# Patient Record
Sex: Male | Born: 1990 | Race: Black or African American | Hispanic: No | Marital: Single | Smoking: Never smoker
Health system: Southern US, Community
[De-identification: ages and names within clinical notes are randomized; demographics above are authoritative.]

## PROBLEM LIST (undated history)

## (undated) DIAGNOSIS — K219 Gastro-esophageal reflux disease without esophagitis: Secondary | ICD-10-CM

## (undated) DIAGNOSIS — R7303 Prediabetes: Secondary | ICD-10-CM

---
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
## 2012-02-14 HISTORY — PX: KNEE ARTHROSCOPY: SHX127

## 2012-07-01 ENCOUNTER — Encounter (HOSPITAL_COMMUNITY): Payer: Self-pay | Admitting: *Deleted

## 2012-07-01 ENCOUNTER — Emergency Department (HOSPITAL_COMMUNITY)
Admission: EM | Admit: 2012-07-01 | Discharge: 2012-07-01 | Disposition: A | Discharge status: Home / Self Care | Payer: BC Managed Care – PPO | Attending: Emergency Medicine | Admitting: Emergency Medicine

## 2012-07-01 DIAGNOSIS — T819XXA Unspecified complication of procedure, initial encounter: Secondary | ICD-10-CM

## 2012-07-01 DIAGNOSIS — IMO0002 Reserved for concepts with insufficient information to code with codable children: Secondary | ICD-10-CM | POA: Insufficient documentation

## 2012-07-01 DIAGNOSIS — K219 Gastro-esophageal reflux disease without esophagitis: Secondary | ICD-10-CM | POA: Insufficient documentation

## 2012-07-01 DIAGNOSIS — M7989 Other specified soft tissue disorders: Secondary | ICD-10-CM | POA: Insufficient documentation

## 2012-07-01 DIAGNOSIS — Y838 Other surgical procedures as the cause of abnormal reaction of the patient, or of later complication, without mention of misadventure at the time of the procedure: Secondary | ICD-10-CM | POA: Insufficient documentation

## 2012-07-01 DIAGNOSIS — Z8679 Personal history of other diseases of the circulatory system: Secondary | ICD-10-CM | POA: Insufficient documentation

## 2012-07-01 DIAGNOSIS — Z79899 Other long term (current) drug therapy: Secondary | ICD-10-CM | POA: Insufficient documentation

## 2012-07-01 NOTE — ED Provider Notes (Signed)
History/physical exam/procedure(s) were performed by non-physician practitioner and as supervising physician I was immediately available for consultation/collaboration. I have reviewed all notes and am in agreement with care and plan.   Hilario Quarry, MD 07/01/12 [phone]

## 2012-07-01 NOTE — ED Provider Notes (Signed)
History     CSN: [id number]  Arrival date & time 07/01/12  0124   First MD Initiated Contact with Patient 07/01/12 0144      Chief Complaint  Patient presents with  . Leg Pain  . Leg Swelling    (Consider location/radiation/quality/duration/timing/severity/associated sxs/prior treatment) HPI History provided by pt.   Pt had an arthroscopic knee procedure 7 days ago.  3 days later he developed edema and pain in right calf.  Had a venous doppler performed in ED in [place] the following day and it was negative for DVT.  Pain is minimal and edema stable, but he noticed ecchymosis of calf today, which concerned him.  Denies trauma but has been wearing an ace bandage very tightly.  No associated fever, CP/SOB, extremity paresthesias or ecchymosis anywhere else.    Past Medical History  Diagnosis Date  . Acid reflux   . Chest pain     Past Surgical History  Procedure Laterality Date  . Knee arthroscopy      No family history on file.  History  Substance Use Topics  . Smoking status: Never Smoker   . Smokeless tobacco: Not on file  . Alcohol Use: No      Review of Systems  All other systems reviewed and are negative.    Allergies  Review of patient's allergies indicates no known allergies.  Home Medications   Current Outpatient Rx  Name  Route  Sig  Dispense  Refill  . omeprazole (PRILOSEC) 40 MG capsule   Oral   Take 40 mg by mouth daily.           BP 152/81  Temp(Src) 97 F (36.1 C) (Oral)  Resp 18  SpO2 100%  Physical Exam  Nursing note and vitals reviewed. Constitutional: He is oriented to person, place, and time. He appears well-developed and well-nourished. No distress.  Morbidly obese  HENT:  Head: Normocephalic and atraumatic.  Eyes:  Normal appearance  Neck: Normal range of motion.  Cardiovascular: Normal rate and regular rhythm.   Pulmonary/Chest: Effort normal and breath sounds normal. No respiratory distress.  Musculoskeletal: Normal  range of motion.  Right calf mildly edematous compared to L and there is dark purple ecchymosis.  Minimally tender.  No induration. R knee w/out edema, erythema, warmth or tenderness and nml active ROM.  Neurological: He is alert and oriented to person, place, and time.  Skin: Skin is warm and dry. No rash noted.  Psychiatric: He has a normal mood and affect. His behavior is normal.    ED Course  Procedures (including critical care time)  Labs Reviewed - No data to display No results found.   1. Post-operative complication, initial encounter       MDM  22yo M had R arthroscopic knee surgery one week ago, presented to ED w/ RLE edema/pain 4 days later and had negative DVT study, and presents today w/ non-traumatic ecchymosis of calf.  Edema and pain stable and no CP/SOB.  Discussed w/ Dr. Rosalia Hammers and she believes that the ecchymosis is related to procedure and that DVT unlikely based on recent nml venous doppler.  Pt reassured.  Recommended ice/elevation.  Strict return precautions discussed.         Otilio Miu, PA-C 07/01/12 [phone]

## 2012-07-01 NOTE — ED Notes (Signed)
Pt discharged.Vital signs stable and GCS 15 

## 2012-07-01 NOTE — ED Notes (Addendum)
Recent R knee arthroscopic knee surgery (done in [place]). RLE swelling & pain onset Thursday. Doppler studies done Friday ("negative for DVT"), has had wrapped with ACE bandage. Drove to [place] today. Concerned about increasing bruising/ discoloration in R calf. Mentions upcoming trip. CMS intact, leg and foot warm equal to left. Pedal pulses palpable. Scant pitting edema +1.

## 2014-02-13 HISTORY — PX: UPPER GI ENDOSCOPY: SHX6162

## 2015-03-14 ENCOUNTER — Emergency Department (HOSPITAL_COMMUNITY)
Admission: EM | Admit: 2015-03-14 | Discharge: 2015-03-14 | Disposition: A | Discharge status: Home / Self Care | Payer: BLUE CROSS/BLUE SHIELD | Source: Home / Self Care | Attending: Emergency Medicine | Admitting: Emergency Medicine

## 2015-03-14 ENCOUNTER — Encounter (HOSPITAL_COMMUNITY): Payer: Self-pay | Admitting: Emergency Medicine

## 2015-03-14 DIAGNOSIS — L509 Urticaria, unspecified: Secondary | ICD-10-CM

## 2015-03-14 MED ORDER — DIPHENHYDRAMINE HCL 25 MG PO CAPS: ORAL_CAPSULE | ORAL | Status: DC

## 2015-03-14 MED ORDER — FAMOTIDINE 20 MG PO TABS: 20.0000 mg | ORAL_TABLET | Freq: Two times a day (BID) | ORAL | Status: DC

## 2015-03-14 MED ORDER — PREDNISONE 10 MG PO TABS: ORAL_TABLET | ORAL | Status: DC

## 2015-03-14 NOTE — ED Notes (Signed)
The patient presented to the [facility] with a complaint of hives that have been there for 3 days.

## 2015-03-14 NOTE — ED Provider Notes (Signed)
CSN: [id number]     Arrival date & time 03/14/15  1818 History   First MD Initiated Contact with Patient 03/14/15 1844     Chief Complaint  Patient presents with  . Urticaria   (Consider location/radiation/quality/duration/timing/severity/associated sxs/prior Treatment) Patient is a 25 y.o. male presenting with urticaria. The history is provided by the patient. No language interpreter was used.  Urticaria This is a new problem. The problem occurs constantly. The problem has been gradually worsening. Pertinent negatives include no chest pain and no abdominal pain. Nothing aggravates the symptoms. Nothing relieves the symptoms. He has tried nothing for the symptoms. The treatment provided no relief.   Pt complains of a full body rash.   Pt complains of itching.  Pt recently changed soap and washing powders. Past Medical History  Diagnosis Date  . Acid reflux   . Chest pain    Past Surgical History  Procedure Laterality Date  . Knee arthroscopy     History reviewed. No pertinent family history. Social History  Substance Use Topics  . Smoking status: Never Smoker   . Smokeless tobacco: None  . Alcohol Use: No    Review of Systems  Cardiovascular: Negative for chest pain.  Gastrointestinal: Negative for abdominal pain.  All other systems reviewed and are negative.   Allergies  Review of patient's allergies indicates no known allergies.  Home Medications   Prior to Admission medications   Medication Sig Start Date End Date Taking? Authorizing Provider  ferrous sulfate 325 (65 FE) MG tablet Take 325 mg by mouth daily with breakfast.   Yes Historical Provider, MD  omeprazole (PRILOSEC) 40 MG capsule Take 40 mg by mouth daily.   Yes Historical Provider, MD  Vitamin D, Ergocalciferol, (DRISDOL) 50000 units CAPS capsule Take 50,000 Units by mouth every 7 (seven) days.   Yes Historical Provider, MD  diphenhydrAMINE (BENADRYL) 25 mg capsule 2 tablets every 4 hours for 2 days 03/14/15    Elson Areas, PA-C  famotidine (PEPCID) 20 MG tablet Take 1 tablet (20 mg total) by mouth 2 (two) times daily. 03/14/15   Elson Areas, PA-C  predniSONE (DELTASONE) 10 MG tablet 4,0,9,8,1,1 03/14/15   Elson Areas, PA-C   Meds Ordered and Administered this Visit  Medications - No data to display  BP 142/102 mmHg  Pulse 106  Temp(Src) 97 F (36.1 C) (Oral)  SpO2 95% No data found.   Physical Exam  Constitutional: He appears well-developed and well-nourished.  HENT:  Head: Normocephalic.  Mouth/Throat: Oropharynx is clear and moist.  Eyes: Conjunctivae are normal. Pupils are equal, round, and reactive to light.  Neck: Normal range of motion. Neck supple.  Cardiovascular: Normal rate.   Pulmonary/Chest: Effort normal.  Abdominal: Soft.  Musculoskeletal: Normal range of motion.  Neurological: He is alert.  Skin: Rash noted. There is erythema.  Psychiatric: He has a normal mood and affect.  Nursing note and vitals reviewed.   ED Course  Procedures (including critical care time)  Labs Review Labs Reviewed - No data to display  Imaging Review No results found.   Visual Acuity Review  Right Eye Distance:   Left Eye Distance:   Bilateral Distance:    Right Eye Near:   Left Eye Near:    Bilateral Near:         MDM  Pt advised hypoallergenic products.     1. Hives    Meds ordered this encounter  Medications  . ferrous sulfate 325 (65 FE) MG  tablet    Sig: Take 325 mg by mouth daily with breakfast.  . Vitamin D, Ergocalciferol, (DRISDOL) 50000 units CAPS capsule    Sig: Take 50,000 Units by mouth every 7 (seven) days.  . famotidine (PEPCID) 20 MG tablet    Sig: Take 1 tablet (20 mg total) by mouth 2 (two) times daily.    Dispense:  20 tablet    Refill:  0    Order Specific Question:  Supervising Provider    Answer:  Charm Rings [id number]  . predniSONE (DELTASONE) 10 MG tablet    Sig: 6,5,4,3,2,1    Dispense:  21 tablet    Refill:  0    Order  Specific Question:  Supervising Provider    Answer:  Charm Rings [id number]  . diphenhydrAMINE (BENADRYL) 25 mg capsule    Sig: 2 tablets every 4 hours for 2 days    Dispense:  30 capsule    Refill:  0    Order Specific Question:  Supervising Provider    Answer:  Charm Rings [phone]  An After Visit Summary was printed and given to the patient.    Elson Areas, PA-C 03/14/15 1957  Elson Areas, PA-C 03/14/15 2003

## 2015-03-14 NOTE — Discharge Instructions (Signed)
Hives Hives are itchy, red, swollen areas of the skin. They can vary in size and location on your body. Hives can come and go for hours or several days (acute hives) or for several weeks (chronic hives). Hives do not spread from person to person (noncontagious). They may get worse with scratching, exercise, and emotional stress. CAUSES   Allergic reaction to food, additives, or drugs.  Infections, including the common cold.  Illness, such as vasculitis, lupus, or thyroid disease.  Exposure to sunlight, heat, or cold.  Exercise.  Stress.  Contact with chemicals. SYMPTOMS   Red or white swollen patches on the skin. The patches may change size, shape, and location quickly and repeatedly.  Itching.  Swelling of the hands, feet, and face. This may occur if hives develop deeper in the skin. DIAGNOSIS  Your caregiver can usually tell what is wrong by performing a physical exam. Skin or blood tests may also be done to determine the cause of your hives. In some cases, the cause cannot be determined. TREATMENT  Mild cases usually get better with medicines such as antihistamines. Severe cases may require an emergency epinephrine injection. If the cause of your hives is known, treatment includes avoiding that trigger.  HOME CARE INSTRUCTIONS   Avoid causes that trigger your hives.  Take antihistamines as directed by your caregiver to reduce the severity of your hives. Non-sedating or low-sedating antihistamines are usually recommended. Do not drive while taking an antihistamine.  Take any other medicines prescribed for itching as directed by your caregiver.  Wear loose-fitting clothing.  Keep all follow-up appointments as directed by your caregiver. SEEK MEDICAL CARE IF:   You have persistent or severe itching that is not relieved with medicine.  You have painful or swollen joints. SEEK IMMEDIATE MEDICAL CARE IF:   You have a fever.  Your tongue or lips are swollen.  You have  trouble breathing or swallowing.  You feel tightness in the throat or chest.  You have abdominal pain. These problems may be the first sign of a life-threatening allergic reaction. Call your local emergency services (911 in [place] MAKE SURE YOU:   Understand these instructions.  Will watch your condition.  Will get help right away if you are not doing well or get worse.   This information is not intended to replace advice given to you by your health care provider. Make sure you discuss any questions you have with your health care provider.   Document Released: 01/30/2005 Document Revised: 02/04/2013 Document Reviewed: 04/25/2011 Elsevier Interactive Patient Education 2016 [employer]  

## 2016-03-30 DIAGNOSIS — H5203 Hypermetropia, bilateral: Secondary | ICD-10-CM | POA: Diagnosis not present

## 2016-07-05 ENCOUNTER — Ambulatory Visit: Payer: BLUE CROSS/BLUE SHIELD | Admitting: Registered"

## 2016-07-06 ENCOUNTER — Ambulatory Visit: Payer: BLUE CROSS/BLUE SHIELD | Admitting: Dietician

## 2016-07-25 ENCOUNTER — Ambulatory Visit: Payer: BLUE CROSS/BLUE SHIELD | Admitting: Registered"

## 2016-10-17 DIAGNOSIS — Z23 Encounter for immunization: Secondary | ICD-10-CM | POA: Diagnosis not present

## 2016-10-17 DIAGNOSIS — D509 Iron deficiency anemia, unspecified: Secondary | ICD-10-CM | POA: Diagnosis not present

## 2016-10-17 DIAGNOSIS — E559 Vitamin D deficiency, unspecified: Secondary | ICD-10-CM | POA: Diagnosis not present

## 2016-10-17 DIAGNOSIS — K219 Gastro-esophageal reflux disease without esophagitis: Secondary | ICD-10-CM | POA: Diagnosis not present

## 2016-10-17 DIAGNOSIS — R030 Elevated blood-pressure reading, without diagnosis of hypertension: Secondary | ICD-10-CM | POA: Diagnosis not present

## 2016-10-17 DIAGNOSIS — Z6841 Body Mass Index (BMI) 40.0 and over, adult: Secondary | ICD-10-CM | POA: Diagnosis not present

## 2016-10-17 DIAGNOSIS — R7303 Prediabetes: Secondary | ICD-10-CM | POA: Diagnosis not present

## 2016-10-17 DIAGNOSIS — Z020 Encounter for examination for admission to educational institution: Secondary | ICD-10-CM | POA: Diagnosis not present

## 2016-11-15 ENCOUNTER — Other Ambulatory Visit (HOSPITAL_COMMUNITY): Payer: Self-pay | Admitting: General Surgery

## 2016-11-15 DIAGNOSIS — M25561 Pain in right knee: Secondary | ICD-10-CM | POA: Diagnosis not present

## 2016-11-15 DIAGNOSIS — K219 Gastro-esophageal reflux disease without esophagitis: Secondary | ICD-10-CM | POA: Diagnosis not present

## 2016-11-15 DIAGNOSIS — M25562 Pain in left knee: Secondary | ICD-10-CM | POA: Diagnosis not present

## 2016-11-15 DIAGNOSIS — G8929 Other chronic pain: Secondary | ICD-10-CM | POA: Diagnosis not present

## 2016-11-15 DIAGNOSIS — K449 Diaphragmatic hernia without obstruction or gangrene: Secondary | ICD-10-CM | POA: Diagnosis not present

## 2016-11-15 DIAGNOSIS — Z6841 Body Mass Index (BMI) 40.0 and over, adult: Secondary | ICD-10-CM | POA: Diagnosis not present

## 2016-11-24 ENCOUNTER — Other Ambulatory Visit: Payer: Self-pay

## 2016-11-24 ENCOUNTER — Ambulatory Visit (HOSPITAL_COMMUNITY)
Admission: RE | Admit: 2016-11-24 | Discharge: 2016-11-24 | Disposition: A | Discharge status: Home / Self Care | Payer: 59 | Source: Ambulatory Visit | Attending: General Surgery | Admitting: General Surgery

## 2016-11-24 DIAGNOSIS — K449 Diaphragmatic hernia without obstruction or gangrene: Secondary | ICD-10-CM | POA: Diagnosis not present

## 2016-11-24 DIAGNOSIS — K219 Gastro-esophageal reflux disease without esophagitis: Secondary | ICD-10-CM | POA: Insufficient documentation

## 2016-11-24 DIAGNOSIS — Z9884 Bariatric surgery status: Secondary | ICD-10-CM | POA: Diagnosis not present

## 2016-11-27 ENCOUNTER — Encounter: Payer: 59 | Attending: General Surgery | Admitting: Registered"

## 2016-11-27 ENCOUNTER — Encounter: Payer: Self-pay | Admitting: Registered"

## 2016-11-27 DIAGNOSIS — E669 Obesity, unspecified: Secondary | ICD-10-CM

## 2016-11-27 DIAGNOSIS — Z713 Dietary counseling and surveillance: Secondary | ICD-10-CM | POA: Diagnosis not present

## 2016-11-27 NOTE — Progress Notes (Signed)
Pre-Op Assessment Visit:  Pre-Operative Sleeve Gastrectomy Surgery  Medical Nutrition Therapy:  Appt start time: 11:15  End time:  12:10  Patient was seen on 11/27/2016 for Pre-Operative Nutrition Assessment. Assessment and letter of approval faxed to [facility] Bariatric Surgery Program coordinator on 11/27/2016.   Pt expectation of surgery: prevent future health risks, be healthier for family  Pt expectation of Dietitian: needs an alternative to ginger ale, support  Start weight at [facility]: 385.8 BMI: 56.16   Pt states he does not eat pork or fried foods often. Pt states his dad had triple bypass surgery recently which was a reality check for him to improve his health.   Per insurance, pt needs 0 SWL visits prior to surgery.    24 hr Dietary Recall: First Meal: sometimes skips; Restaurant-sausage, grits, bacon, eggs, 2 slices of toast Snack: none Second Meal: sometimes skips; [employer]-grilled chicken salad Snack: sometimes nabs Third Meal: Sushi or grilled salmon, spinach Snack: none Beverages: water, ginger ale  Encouraged to engage in 75 minutes of moderate physical activity including cardiovascular and weight baring weekly  Handouts given during visit include:  . Pre-Op Goals . Bariatric Surgery Protein Shakes . Vitamin and Mineral Recommendations  During the appointment today the following Pre-Op Goals were reviewed with the patient: . Maintain or lose weight as instructed by your surgeon . Make healthy food choices . Begin to limit portion sizes . Limited concentrated sugars and fried foods . Keep fat/sugar in the single digits per serving on          food labels . Practice CHEWING your food  (aim for 30 chews per bite or until applesauce consistency) . Practice not drinking 15 minutes before, during, and 30 minutes after each meal/snack . Avoid all carbonated beverages  . Avoid/limit caffeinated beverages  . Avoid all sugar-sweetened  beverages . Consume 3 meals per day; eat every 3-5 hours . Make a list of non-food related activities . Aim for 64-100 ounces of FLUID daily  . Aim for at least 60-80 grams of PROTEIN daily . Look for a liquid protein source that contain ?15 g protein and ?5 g carbohydrate  (ex: shakes, drinks, shots) . Physical activity is an important part of a healthy lifestyle so keep it moving!  Follow diet recommendations listed below Energy and Macronutrient Recommendations: Calories: 2000 Carbohydrate: 225 Protein: 150 Fat: 56  Demonstrated degree of understanding via:  Teach Back   Teaching Method Utilized:  Visual Auditory Hands on  Barriers to learning/adherence to lifestyle change: none  Patient to call the [facility] Education Services to enroll in Pre-Op and Post-Op Nutrition Education when surgery date is scheduled.

## 2016-12-28 ENCOUNTER — Ambulatory Visit: Payer: 59 | Admitting: Psychology

## 2016-12-28 DIAGNOSIS — F509 Eating disorder, unspecified: Secondary | ICD-10-CM | POA: Diagnosis not present

## 2017-01-01 ENCOUNTER — Encounter: Payer: Self-pay | Admitting: Skilled Nursing Facility1

## 2017-01-01 ENCOUNTER — Encounter: Payer: 59 | Attending: General Surgery | Admitting: Skilled Nursing Facility1

## 2017-01-01 DIAGNOSIS — E669 Obesity, unspecified: Secondary | ICD-10-CM

## 2017-01-01 DIAGNOSIS — Z713 Dietary counseling and surveillance: Secondary | ICD-10-CM | POA: Insufficient documentation

## 2017-01-01 NOTE — Progress Notes (Signed)
Pre-Operative Nutrition Class:  Appt start time: 9628   End time:  1830.  Patient was seen on 01/01/2017 for Pre-Operative Bariatric Surgery Education [facility].   Surgery date:  Surgery type: sleeve Start weight at [facility]: 395.8 Weight today: pt arrived too late  Samples given per MNT protocol. Patient educated on appropriate usage: Bariatric Advantage Multivitamin Lot # [id number] Exp: 7/19  Bariatric Advantage Calcium  Lot # [id number] Exp: sep-09-2017  Unjury Protein Shake Lot # [id number]  The following the learning objectives were met by the patient during this course:  Identify Pre-Op Dietary Goals and will begin 2 weeks pre-operatively  Identify appropriate sources of fluids and proteins   State protein recommendations and appropriate sources pre and post-operatively  Identify Post-Operative Dietary Goals and will follow for 2 weeks post-operatively  Identify appropriate multivitamin and calcium sources  Describe the need for physical activity post-operatively and will follow MD recommendations  State when to call healthcare provider regarding medication questions or post-operative complications  Handouts given during class include:  Pre-Op Bariatric Surgery Diet Handout  Protein Shake Handout  Post-Op Bariatric Surgery Nutrition Handout  BELT Program Information Flyer  Support Group Information Flyer  [facility] Bariatric Supplements Price List  Follow-Up Plan: [facility] for diet advancement per MD.

## 2017-01-08 ENCOUNTER — Ambulatory Visit (INDEPENDENT_AMBULATORY_CARE_PROVIDER_SITE_OTHER): Payer: 59 | Admitting: Psychology

## 2017-01-08 DIAGNOSIS — F509 Eating disorder, unspecified: Secondary | ICD-10-CM | POA: Diagnosis not present

## 2017-02-27 ENCOUNTER — Other Ambulatory Visit: Payer: Self-pay | Admitting: *Deleted

## 2017-02-27 NOTE — Patient Outreach (Addendum)
[facility]) Care Management  02/27/2017  David House 1990-06-28 [id number]   Subjective: Telephone call to patient's home / mobile number, spoke with patient, and HIPAA verified.  Discussed [facility] Care Management UMR Transition of care follow up, preoperative call follow up, patient voiced understanding, and is in agreement to both types of follow up.   Patient states he is doing well, ready for surgery at [facility] on 03/05/17, estimated length of stay 1-2 days, wife (David House) and father David Vandecar Sr.) will assist as needed with activities of daily living / home management.  Patient voices understanding of medical diagnosis, pending surgery, and treatment plan.   Patient states his wife is Producer, television/film/video.  States he is accessing the following [facility] benefits: outpatient pharmacy, hospital indemnity (will verify with wife if this benefit chosen, she will file claim if appropriate), and wife has family medical leave act (FMLA) in place for patient.   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   Discussed Advanced Directives document completion benefit for [employer] Employees through [facility] Spiritual care, patient voiced understanding, and declined resource at this time.   Patient states he does not have any preoperative questions, care coordination, disease management, disease monitoring, transportation, community resource, or pharmacy needs at this time.  States he is very appreciative of the follow up and is in agreement to receive [facility] Care Management information post transition of care follow up.     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      Assessment: Received UMR  Preoperative / Transition of care referral on 02/21/17.   Preoperative call completed, and  transition of care follow up pending notification of patient discharge.     Plan: RNCM will call patient for  telephone outreach attempt, transition of care follow up, within 3 business days of hospital discharge notification. RNCM will send emergency contact update David House [phone])  request to Iverson Alamin at [facility] Care Management per patient's request.       Garnie Borchardt H. Gardiner Barefoot, BSN, CCM [facility] Care Management [facility] Telephonic CM Phone: [phone] Fax: [phone]

## 2017-02-28 NOTE — Progress Notes (Signed)
Please place orders in epic . Pre-op is 03-02-17. Thanks !

## 2017-02-28 NOTE — Patient Instructions (Signed)
David House  02/28/2017   Your procedure is scheduled on: 03-05-17  Report to [facility] Main  Entrance    Report to admitting at 7:45AM   Call this number if you have problems the morning of surgery [phone]     Remember: Do not eat food or drink liquids :After Midnight.     Take these medicines the morning of surgery with A SIP OF WATER: omeprazole(prilosec)                                 You may not have any metal on your body including hair pins and              piercings  Do not wear jewelry, make-up, lotions, powders or perfumes, deodorant                   Men may shave face and neck.   Do not bring valuables to the hospital. [facility] IS NOT             RESPONSIBLE   FOR VALUABLES.  Contacts, dentures or bridgework may not be worn into surgery.  Leave suitcase in the car. After surgery it may be brought to your room.                Please read over the following fact sheets you were given: _____________________________________________________________________             [facility] - Preparing for Surgery Before surgery, you can play an important role.  Because skin is not sterile, your skin needs to be as free of germs as possible.  You can reduce the number of germs on your skin by washing with CHG (chlorahexidine gluconate) soap before surgery.  CHG is an antiseptic cleaner which kills germs and bonds with the skin to continue killing germs even after washing. Please DO NOT use if you have an allergy to CHG or antibacterial soaps.  If your skin becomes reddened/irritated stop using the CHG and inform your nurse when you arrive at Short Stay. Do not shave (including legs and underarms) for at least 48 hours prior to the first CHG shower.  You may shave your face/neck. Please follow these instructions carefully:  1.  Shower with CHG Soap the night before surgery and the  morning of Surgery.  2.  If you choose to wash your hair, wash  your hair first as usual with your  normal  shampoo.  3.  After you shampoo, rinse your hair and body thoroughly to remove the  shampoo.                           4.  Use CHG as you would any other liquid soap.  You can apply chg directly  to the skin and wash                       Gently with a scrungie or clean washcloth.  5.  Apply the CHG Soap to your body ONLY FROM THE NECK DOWN.   Do not use on face/ open                           Wound or open sores. Avoid  contact with eyes, ears mouth and genitals (private parts).                       Wash face,  Genitals (private parts) with your normal soap.             6.  Wash thoroughly, paying special attention to the area where your surgery  will be performed.  7.  Thoroughly rinse your body with warm water from the neck down.  8.  DO NOT shower/wash with your normal soap after using and rinsing off  the CHG Soap.                9.  Pat yourself dry with a clean towel.            10.  Wear clean pajamas.            11.  Place clean sheets on your bed the night of your first shower and do not  sleep with pets. Day of Surgery : Do not apply any lotions/deodorants the morning of surgery.  Please wear clean clothes to the hospital/surgery center.  FAILURE TO FOLLOW THESE INSTRUCTIONS MAY RESULT IN THE CANCELLATION OF YOUR SURGERY PATIENT SIGNATURE_________________________________  NURSE SIGNATURE__________________________________  ________________________________________________________________________

## 2017-02-28 NOTE — Progress Notes (Signed)
EKG 11-24-16 epic  CXR 11-24-16 epic

## 2017-03-01 ENCOUNTER — Ambulatory Visit: Payer: Self-pay | Admitting: General Surgery

## 2017-03-01 NOTE — Patient Instructions (Addendum)
David House  03/01/2017   Your procedure is scheduled on: 03-05-17  Report to [facility] Main  Entrance  Report to admitting at 745 AM  Call this number if you have problems the morning of surgery [phone]   Remember: Do not eat food  :After Midnight.  NO SOLID FOOD AFTER MIDNIGHT THE NIGHT PRIOR TO SURGERY. NOTHING BY MOUTH EXCEPT CLEAR LIQUIDS UNTIL 3 HOURS PRIOR TO SCHEULED SURGERY. PLEASE FINISH ENSURE DRINK PER SURGEON ORDER 3 HOURS PRIOR TO SCHEDULED SURGERY TIME WHICH NEEDS TO BE COMPLETED AT 645 am    CLEAR LIQUID DIET   Foods Allowed                                                                     Foods Excluded  Coffee and tea, regular and decaf                             liquids that you cannot  Plain Jell-O in any flavor                                             see through such as: Fruit ices (not with fruit pulp)                                     milk, soups, orange juice  Iced Popsicles                                    All solid food Carbonated beverages, regular and diet                                    Cranberry, grape and apple juices Sports drinks like Gatorade Lightly seasoned clear broth or consume(fat free) Sugar, honey syrup  Sample Menu Breakfast                                Lunch                                     Supper Cranberry juice                    Beef broth                            Chicken broth Jell-O                                     Grape juice  Apple juice Coffee or tea                        Jell-O                                      Popsicle                                                Coffee or tea                        Coffee or tea  _____________________________________________________________________     Take these medicines the morning of surgery with A SIP OF WATER: OMEPRAZOLE (PRILOSEC)              You may not have any metal on your body including hair  pins and              piercings  Do not wear jewelry, make-up, lotions, powders or perfumes, deodorant             Do not wear nail polish.  Do not shave  48 hours prior to surgery.              Men may shave face and neck.   Do not bring valuables to the hospital. [facility] IS NOT             RESPONSIBLE   FOR VALUABLES.  Contacts, dentures or bridgework may not be worn into surgery.  Leave suitcase in the car. After surgery it may be brought to your room.                  Please read over the following fact sheets you were given: _____________________________________________________________________             .[employer] - Preparing for Surgery Before surgery, you can play an important role.  Because skin is not sterile, your skin needs to be as free of germs as possible.  You can reduce the number of germs on your skin by washing with CHG (chlorahexidine gluconate) soap before surgery.  CHG is an antiseptic cleaner which kills germs and bonds with the skin to continue killing germs even after washing. Please DO NOT use if you have an allergy to CHG or antibacterial soaps.  If your skin becomes reddened/irritated stop using the CHG and inform your nurse when you arrive at Short Stay. Do not shave (including legs and underarms) for at least 48 hours prior to the first CHG shower.  You may shave your face/neck. Please follow these instructions carefully:  1.  Shower with CHG Soap the night before surgery and the  morning of Surgery.  2.  If you choose to wash your hair, wash your hair first as usual with your  normal  shampoo.  3.  After you shampoo, rinse your hair and body thoroughly to remove the  shampoo.                           4.  Use CHG as you would any other liquid soap.  You can apply chg directly  to the skin and wash  Gently with a scrungie or clean washcloth.  5.  Apply the CHG Soap to your body ONLY FROM THE NECK DOWN.   Do not use on face/ open                            Wound or open sores. Avoid contact with eyes, ears mouth and genitals (private parts).                       Wash face,  Genitals (private parts) with your normal soap.             6.  Wash thoroughly, paying special attention to the area where your surgery  will be performed.  7.  Thoroughly rinse your body with warm water from the neck down.  8.  DO NOT shower/wash with your normal soap after using and rinsing off  the CHG Soap.                9.  Pat yourself dry with a clean towel.            10.  Wear clean pajamas.            11.  Place clean sheets on your bed the night of your first shower and do not  sleep with pets. Day of Surgery : Do not apply any lotions/deodorants the morning of surgery.  Please wear clean clothes to the hospital/surgery center.  FAILURE TO FOLLOW THESE INSTRUCTIONS MAY RESULT IN THE CANCELLATION OF YOUR SURGERY PATIENT SIGNATURE_________________________________  NURSE SIGNATURE__________________________________  ________________________________________________________________________   Adam Phenix  An incentive spirometer is a tool that can help keep your lungs clear and active. This tool measures how well you are filling your lungs with each breath. Taking long deep breaths may help reverse or decrease the chance of developing breathing (pulmonary) problems (especially infection) following:  A long period of time when you are unable to move or be active. BEFORE THE PROCEDURE   If the spirometer includes an indicator to show your best effort, your nurse or respiratory therapist will set it to a desired goal.  If possible, sit up straight or lean slightly forward. Try not to slouch.  Hold the incentive spirometer in an upright position. INSTRUCTIONS FOR USE  1. Sit on the edge of your bed if possible, or sit up as far as you can in bed or on a chair. 2. Hold the incentive spirometer in an upright position. 3. Breathe out  normally. 4. Place the mouthpiece in your mouth and seal your lips tightly around it. 5. Breathe in slowly and as deeply as possible, raising the piston or the ball toward the top of the column. 6. Hold your breath for 3-5 seconds or for as long as possible. Allow the piston or ball to fall to the bottom of the column. 7. Remove the mouthpiece from your mouth and breathe out normally. 8. Rest for a few seconds and repeat Steps 1 through 7 at least 10 times every 1-2 hours when you are awake. Take your time and take a few normal breaths between deep breaths. 9. The spirometer may include an indicator to show your best effort. Use the indicator as a goal to work toward during each repetition. 10. After each set of 10 deep breaths, practice coughing to be sure your lungs are clear. If you have an incision (the cut made at the time of  surgery), support your incision when coughing by placing a pillow or rolled up towels firmly against it. Once you are able to get out of bed, walk around indoors and cough well. You may stop using the incentive spirometer when instructed by your caregiver.  RISKS AND COMPLICATIONS  Take your time so you do not get dizzy or light-headed.  If you are in pain, you may need to take or ask for pain medication before doing incentive spirometry. It is harder to take a deep breath if you are having pain. AFTER USE  Rest and breathe slowly and easily.  It can be helpful to keep track of a log of your progress. Your caregiver can provide you with a simple table to help with this. If you are using the spirometer at home, follow these instructions: [place] IF:   You are having difficultly using the spirometer.  You have trouble using the spirometer as often as instructed.  Your pain medication is not giving enough relief while using the spirometer.  You develop fever of 100.5 F (38.1 C) or higher. SEEK IMMEDIATE MEDICAL CARE IF:   You cough up bloody sputum  that had not been present before.  You develop fever of 102 F (38.9 C) or greater.  You develop worsening pain at or near the incision site. MAKE SURE YOU:   Understand these instructions.  Will watch your condition.  Will get help right away if you are not doing well or get worse. Document Released: 06/12/2006 Document Revised: 04/24/2011 Document Reviewed: 08/13/2006 [facility] Patient Information 2014 [place], [place].   ________________________________________________________________________

## 2017-03-01 NOTE — Progress Notes (Signed)
EKG 11-24-16 Epic CHEST XRAY 11-24-17-8 EPIC

## 2017-03-02 ENCOUNTER — Inpatient Hospital Stay (HOSPITAL_COMMUNITY)
Admission: RE | Admit: 2017-03-02 | Discharge: 2017-03-02 | Disposition: A | Discharge status: Home / Self Care | Payer: Self-pay | Source: Ambulatory Visit

## 2017-03-02 ENCOUNTER — Encounter (HOSPITAL_COMMUNITY)
Admission: RE | Admit: 2017-03-02 | Discharge: 2017-03-02 | Disposition: A | Discharge status: Home / Self Care | Payer: 59 | Source: Ambulatory Visit | Attending: General Surgery | Admitting: General Surgery

## 2017-03-02 ENCOUNTER — Other Ambulatory Visit: Payer: Self-pay

## 2017-03-02 ENCOUNTER — Encounter (HOSPITAL_COMMUNITY): Payer: Self-pay

## 2017-03-02 ENCOUNTER — Other Ambulatory Visit (HOSPITAL_COMMUNITY): Payer: Self-pay | Admitting: *Deleted

## 2017-03-02 ENCOUNTER — Ambulatory Visit: Payer: Self-pay | Admitting: General Surgery

## 2017-03-02 DIAGNOSIS — Z6841 Body Mass Index (BMI) 40.0 and over, adult: Secondary | ICD-10-CM | POA: Diagnosis not present

## 2017-03-02 DIAGNOSIS — K219 Gastro-esophageal reflux disease without esophagitis: Secondary | ICD-10-CM | POA: Diagnosis not present

## 2017-03-02 DIAGNOSIS — M25562 Pain in left knee: Secondary | ICD-10-CM | POA: Diagnosis not present

## 2017-03-02 DIAGNOSIS — G8929 Other chronic pain: Secondary | ICD-10-CM | POA: Diagnosis not present

## 2017-03-02 DIAGNOSIS — E7800 Pure hypercholesterolemia, unspecified: Secondary | ICD-10-CM | POA: Diagnosis not present

## 2017-03-02 DIAGNOSIS — K449 Diaphragmatic hernia without obstruction or gangrene: Secondary | ICD-10-CM | POA: Diagnosis not present

## 2017-03-02 DIAGNOSIS — M25561 Pain in right knee: Secondary | ICD-10-CM | POA: Diagnosis not present

## 2017-03-02 HISTORY — DX: Prediabetes: R73.03

## 2017-03-02 LAB — HEMOGLOBIN A1C
Hgb A1c MFr Bld: 5.8 % — ABNORMAL HIGH (ref 4.8–5.6)
Mean Plasma Glucose: 119.76 mg/dL

## 2017-03-02 LAB — COMPREHENSIVE METABOLIC PANEL
ALK PHOS: 81 U/L (ref 38–126)
ALT: 29 U/L (ref 17–63)
AST: 38 U/L (ref 15–41)
Albumin: 4.3 g/dL (ref 3.5–5.0)
Anion gap: 7 (ref 5–15)
BILIRUBIN TOTAL: 0.6 mg/dL (ref 0.3–1.2)
BUN: 14 mg/dL (ref 6–20)
CO2: 25 mmol/L (ref 22–32)
CREATININE: 0.98 mg/dL (ref 0.61–1.24)
Calcium: 9.4 mg/dL (ref 8.9–10.3)
Chloride: 106 mmol/L (ref 101–111)
GFR calc Af Amer: >60 mL/min (ref >60)
GFR calc non Af Amer: >60 mL/min (ref >60)
Glucose, Bld: 112 mg/dL — ABNORMAL HIGH (ref 65–99)
Potassium: 3.9 mmol/L (ref 3.5–5.1)
Sodium: 138 mmol/L (ref 135–145)
TOTAL PROTEIN: 8.4 g/dL — AB (ref 6.5–8.1)

## 2017-03-02 LAB — CBC WITH DIFFERENTIAL/PLATELET
BASOS ABS: 0 10*3/uL (ref 0.0–0.1)
Basophils Relative: 0 %
Eosinophils Absolute: 0.1 10*3/uL (ref 0.0–0.7)
Eosinophils Relative: 2 %
HEMATOCRIT: 39.9 % (ref 39.0–52.0)
Hemoglobin: 12.6 g/dL — ABNORMAL LOW (ref 13.0–17.0)
LYMPHS PCT: 39 %
Lymphs Abs: 2.7 10*3/uL (ref 0.7–4.0)
MCH: 26.5 pg (ref 26.0–34.0)
MCHC: 31.6 g/dL (ref 30.0–36.0)
MCV: 83.8 fL (ref 78.0–100.0)
Monocytes Absolute: 0.5 10*3/uL (ref 0.1–1.0)
Monocytes Relative: 7 %
NEUTROS ABS: 3.6 10*3/uL (ref 1.7–7.7)
Neutrophils Relative %: 52 %
Platelets: 282 10*3/uL (ref 150–400)
RBC: 4.76 MIL/uL (ref 4.22–5.81)
RDW: 16.3 % — ABNORMAL HIGH (ref 11.5–15.5)
WBC: 6.8 10*3/uL (ref 4.0–10.5)

## 2017-03-02 LAB — ABO/RH: ABO/RH(D): O POS

## 2017-03-02 MED FILL — PANTOPRAZOLE SOD DR 40 MG T: 40 | 30 days supply | Qty: 30 | Fill #0

## 2017-03-02 MED FILL — ONDANSETRON HCL 4 MG TABLET: 4 | 5 days supply | Qty: 15 | Fill #0

## 2017-03-02 NOTE — H&P (Signed)
David House Married / Language: English / Race: Black or African American Male  History of Present Illness David [name] House; 03/02/2017 8:53 AM) The patient is a 27 year old male who presents for a bariatric surgery evaluation. He comes in for his preoperative appointment. He denies any medical changes since he was initially seen. He denies any trips to the emergency room or hospital. He denies any smoking. He denies any chest pain, chest pressure, shortness of breath or angina. He does have some occasional reflux. This upper GI did show a possible small hiatal hernia. Otherwise his bariatric evaluation was unremarkable   11/24/2016 He is referred by Dr David House for evaluation of weight loss surgery. He completed our seminar on line. He is interested in a sleeve gastrectomy. He states his PCP recommended this he also has several acquaintances who have had sleeve gastrectomy. He is not interested in having his intestines rerouted. He wants to improve his overall health.  Despite numerous attempts for sustained weight loss he has been unsuccessful. He has tried Atkins, phentermine, regular gym sessions, saxenda - all without any long-term weight loss. He also did medical weight loss with [facility] several years ago.  He denies any chest pain, chest pressure, shortness of breath, orthopnea, paroxysmal nocturnal dyspnea, dyspnea on exertion, peripheral edema. He denies any personal or family history of blood clots. He does have reflux. He describes it as a burning sensation. He takes omeprazole. He has daily bowel movements. He reports he had an upper endoscopy about 2 years ago. He denies any melena or hematochezia. He denies any dysuria or hematuria. He has bilateral knee pain which is chronic. He states he is prediabetic. He denies any TIAs or amaurosis fugax. He denies any vision  changes. He denies any severe headache issues. He denies any tobacco. He drinks alcohol on a rare occasion.    Problem List/Past Medical David House; 03/02/2017 8:55 AM) BILATERAL CHRONIC KNEE PAIN (M25.561, M25.562) HIATAL HERNIA (K44.9) MORBID OBESITY WITH BMI OF 50.0-59.9, ADULT (E66.01)  Past Surgical History David House; 03/02/2017 8:55 AM) Knee Surgery Right. Oral Surgery  Diagnostic Studies History David House; 03/02/2017 8:55 AM) Colonoscopy never  Allergies David House; 03/02/2017 8:55 AM) No Known Allergies [11/15/2016]:  Medication History David House; 03/02/2017 8:55 AM) OxyCODONE HCl (5MG /5ML Solution, 5-10 Oral every four hours, as needed, Taken starting 03/02/2017) Active. Zofran (4MG  Tablet, 1 (one) Oral every eight hours, as needed, Taken starting 03/02/2017) Active. Protonix (40MG  Tablet DR, 1 (one) Oral daily, Taken starting 03/02/2017) Active. Omeprazole (40MG  Capsule DR, Oral) Active. Vitamin D (50000U Capsule, Oral) Active.  Social History David House; 03/02/2017 8:55 AM) Caffeine use Carbonated beverages, Coffee, Tea. No alcohol use No drug use Tobacco use Never smoker.  Family History David House; 03/02/2017 8:55 AM) Arthritis Family Members In General, Father, Mother. Colon Cancer Family Members In General. Colon Polyps Father. Diabetes Mellitus Family Members In General. Hypertension Family Members In General, Father, Mother. Ovarian Cancer Mother. Respiratory Condition Family Members In General.  Other Problems David House; 03/02/2017 8:55 AM) Hypercholesterolemia GASTROESOPHAGEAL REFLUX DISEASE WITHOUT ESOPHAGITIS (K21.9)     Review of Systems David Areola M. Jordyn Doane House; 03/02/2017 8:54 AM) General Present- Fatigue. Not Present- Appetite Loss, Chills, Fever, Night Sweats, Weight Gain and Weight Loss. Skin Not Present- Change in Wart/Mole, Dryness, Hives,  Jaundice, New  Lesions, Non-Healing Wounds, Rash and Ulcer. HEENT Present- Seasonal Allergies and Wears glasses/contact lenses. Not Present- Earache, Hearing Loss, Hoarseness, Nose Bleed, Oral Ulcers, Ringing in the Ears, Sinus Pain, Sore Throat, Visual Disturbances and Yellow Eyes. Respiratory Present- Snoring. Not Present- Bloody sputum, Chronic Cough, Difficulty Breathing and Wheezing. Breast Not Present- Breast Mass, Breast Pain, Nipple Discharge and Skin Changes. Gastrointestinal Present- Indigestion. Not Present- Abdominal Pain, Bloating, Bloody Stool, Change in Bowel Habits, Chronic diarrhea, Constipation, Difficulty Swallowing, Excessive gas, Gets full quickly at meals, Hemorrhoids, Nausea, Rectal Pain and Vomiting. Male Genitourinary Not Present- Blood in Urine, Change in Urinary Stream, Frequency, Impotence, Nocturia, Painful Urination, Urgency and Urine Leakage. Musculoskeletal Present- Joint Pain and Joint Stiffness. Not Present- Back Pain, Muscle Pain, Muscle Weakness and Swelling of Extremities. Neurological Not Present- Decreased Memory, Fainting, Headaches, Numbness, Seizures, Tingling, Tremor, Trouble walking and Weakness. Psychiatric Not Present- Anxiety, Bipolar, Change in Sleep Pattern, Depression, Fearful and Frequent crying. Hematology Not Present- Blood Thinners, Easy Bruising, Excessive bleeding, Gland problems, HIV and Persistent Infections.  Vitals (David House CMA; 03/02/2017 8:25 AM) 03/02/2017 8:25 AM Weight: 374.6 lb Height: 69.2in Body Surface Area: 2.7 m Body Mass Index: 55 kg/m  Pulse: 96 (Regular)  BP: 132/82 (Sitting, Left Arm, Standard)      Physical Exam David Areola M. Caridad Silveira House; 03/02/2017 8:52 AM)  General Mental Status-Alert. General Appearance-Consistent with stated age. Hydration-Well hydrated. Voice-Normal. Note: severe obesity; evenly distributed  Head and Neck Head-normocephalic, atraumatic with no lesions or palpable  masses. Trachea-midline. Thyroid Gland Characteristics - normal size and consistency.  Eye Eyeball - Bilateral-Extraocular movements intact. Sclera/Conjunctiva - Bilateral-No scleral icterus.  ENMT Ears -Note:normal external ears.  Mouth and Throat -Note:lips intact.   Chest and Lung Exam Chest and lung exam reveals -quiet, even and easy respiratory effort with no use of accessory muscles and on auscultation, normal breath sounds, no adventitious sounds and normal vocal resonance. Inspection Chest Wall - Normal. Back - normal.  Breast - Did not examine.  Cardiovascular Cardiovascular examination reveals -normal heart sounds, regular rate and rhythm with no murmurs and normal pedal pulses bilaterally.  Abdomen Inspection Inspection of the abdomen reveals - No Hernias. Skin - Scar - no surgical scars. Palpation/Percussion Palpation and Percussion of the abdomen reveal - Soft, Non Tender, No Rebound tenderness, No Rigidity (guarding) and No hepatosplenomegaly. Auscultation Auscultation of the abdomen reveals - Bowel sounds normal.  Peripheral Vascular Upper Extremity Palpation - Pulses bilaterally normal.  Neurologic Neurologic evaluation reveals -alert and oriented x 3 with no impairment of recent or remote memory. Mental Status-Normal.  Neuropsychiatric The patient's mood and affect are described as -normal. Judgment and Insight-insight is appropriate concerning matters relevant to self.  Musculoskeletal Normal Exam - Left-Upper Extremity Strength Normal and Lower Extremity Strength Normal. Normal Exam - Right-Upper Extremity Strength Normal and Lower Extremity Strength Normal.  Lymphatic Head & Neck  General Head & Neck Lymphatics: Bilateral - Description - Normal. Axillary - Did not examine. Femoral & Inguinal - Did not examine.    Assessment & Plan David Areola M. Mayley Lish House; 03/02/2017 8:55 AM)  MORBID OBESITY WITH BMI OF 50.0-59.9,  ADULT (E66.01) Impression: We reviewed his workup including his labs and imaging. We discussed the findings of a small sliding hiatal hernia on upper GI. We discussed that we would look for one intraoperatively and found to have a clinically significant 1 I would recommend repair. We discussed what that would entail. We discussed the typical hospital course as well as  the typical recovery period I congratulated him on his weight loss so far. He was given his postoperative prescriptions today. All of his questions were asked and answered  Current Plans Started Zofran 4 MG Oral Tablet, 1 (one) Tablet every eight hours, as needed, #15, 03/02/2017, Ref. x1. Started OxyCODONE HCl 5 MG/5ML Oral Solution, 5-10 Milliliter every four hours, as needed, 100 Milliliter, 03/02/2017, No Refill. Started Protonix 40 MG Oral Tablet Delayed Release, 1 (one) Tablet daily, #30, 03/02/2017, Ref. x2. Pt Education - EMW_preopbariatric  HIATAL HERNIA (K44.9) Impression: We reviewed his upper endoscopy findings from 2016. If in fact the upper GI does confirm a hiatal hernia even if it is small I did recommend repairing during his sleeve gastrectomy in order to minimize potentially worsening reflux. I did inform the patient that even if we repaired the hiatal hernia it does not preclude the possibility of worsening heartburn.   BILATERAL CHRONIC KNEE PAIN (M25.561)  Mary Sella. Andrey Campanile, House, FACS General, Bariatric, & Minimally Invasive Surgery [facility], [place]

## 2017-03-02 NOTE — H&P (View-Only) (Signed)
David House Documented: 03/02/2017 8:25 AM Location: [facility] Patient #: [id number] DOB: 25-Jul-1990 Married / Language: English / Race: Black or African American Male  History of Present Illness David Areola M. Quamel Fitzmaurice MD; 03/02/2017 8:53 AM) The patient is a 27 year old male who presents for a bariatric surgery evaluation. He comes in for his preoperative appointment. He denies any medical changes since he was initially seen. He denies any trips to the emergency room or hospital. He denies any smoking. He denies any chest pain, chest pressure, shortness of breath or angina. He does have some occasional reflux. This upper GI did show a possible small hiatal hernia. Otherwise his bariatric evaluation was unremarkable   11/24/2016 He is referred by Dr Devra Dopp for evaluation of weight loss surgery. He completed our seminar on line. He is interested in a sleeve gastrectomy. He states his PCP recommended this he also has several acquaintances who have had sleeve gastrectomy. He is not interested in having his intestines rerouted. He wants to improve his overall health.  Despite numerous attempts for sustained weight loss he has been unsuccessful. He has tried Atkins, phentermine, regular gym sessions, saxenda - all without any long-term weight loss. He also did medical weight loss with [facility] several years ago.  He denies any chest pain, chest pressure, shortness of breath, orthopnea, paroxysmal nocturnal dyspnea, dyspnea on exertion, peripheral edema. He denies any personal or family history of blood clots. He does have reflux. He describes it as a burning sensation. He takes omeprazole. He has daily bowel movements. He reports he had an upper endoscopy about 2 years ago. He denies any melena or hematochezia. He denies any dysuria or hematuria. He has bilateral knee pain which is chronic. He states he is prediabetic. He denies any TIAs or amaurosis fugax. He denies any vision  changes. He denies any severe headache issues. He denies any tobacco. He drinks alcohol on a rare occasion.    Problem List/Past Medical David Areola M. Andrey Campanile, MD; 03/02/2017 8:55 AM) BILATERAL CHRONIC KNEE PAIN (M25.561, M25.562) HIATAL HERNIA (K44.9) MORBID OBESITY WITH BMI OF 50.0-59.9, ADULT (E66.01)  Past Surgical History David Areola M. Andrey Campanile, MD; 03/02/2017 8:55 AM) Knee Surgery Right. Oral Surgery  Diagnostic Studies History David Areola M. Andrey Campanile, MD; 03/02/2017 8:55 AM) Colonoscopy never  Allergies David Areola M. Andrey Campanile, MD; 03/02/2017 8:55 AM) No Known Allergies [11/15/2016]:  Medication History David Areola M. Andrey Campanile, MD; 03/02/2017 8:55 AM) OxyCODONE HCl (5MG /5ML Solution, 5-10 Oral every four hours, as needed, Taken starting 03/02/2017) Active. Zofran (4MG  Tablet, 1 (one) Oral every eight hours, as needed, Taken starting 03/02/2017) Active. Protonix (40MG  Tablet DR, 1 (one) Oral daily, Taken starting 03/02/2017) Active. Omeprazole (40MG  Capsule DR, Oral) Active. Vitamin D (50000U Capsule, Oral) Active.  Social History David Areola M. Andrey Campanile, MD; 03/02/2017 8:55 AM) Caffeine use Carbonated beverages, Coffee, Tea. No alcohol use No drug use Tobacco use Never smoker.  Family History David Areola M. Andrey Campanile, MD; 03/02/2017 8:55 AM) Arthritis Family Members In General, Father, Mother. Colon Cancer Family Members In General. Colon Polyps Father. Diabetes Mellitus Family Members In General. Hypertension Family Members In General, Father, Mother. Ovarian Cancer Mother. Respiratory Condition Family Members In General.  Other Problems David Areola M. Andrey Campanile, MD; 03/02/2017 8:55 AM) Hypercholesterolemia GASTROESOPHAGEAL REFLUX DISEASE WITHOUT ESOPHAGITIS (K21.9)     Review of Systems David Areola M. Danie Diehl MD; 03/02/2017 8:54 AM) General Present- Fatigue. Not Present- Appetite Loss, Chills, Fever, Night Sweats, Weight Gain and Weight Loss. Skin Not Present- Change in Wart/Mole, Dryness, Hives,  Jaundice, New  Lesions, Non-Healing Wounds, Rash and Ulcer. HEENT Present- Seasonal Allergies and Wears glasses/contact lenses. Not Present- Earache, Hearing Loss, Hoarseness, Nose Bleed, Oral Ulcers, Ringing in the Ears, Sinus Pain, Sore Throat, Visual Disturbances and Yellow Eyes. Respiratory Present- Snoring. Not Present- Bloody sputum, Chronic Cough, Difficulty Breathing and Wheezing. Breast Not Present- Breast Mass, Breast Pain, Nipple Discharge and Skin Changes. Gastrointestinal Present- Indigestion. Not Present- Abdominal Pain, Bloating, Bloody Stool, Change in Bowel Habits, Chronic diarrhea, Constipation, Difficulty Swallowing, Excessive gas, Gets full quickly at meals, Hemorrhoids, Nausea, Rectal Pain and Vomiting. Male Genitourinary Not Present- Blood in Urine, Change in Urinary Stream, Frequency, Impotence, Nocturia, Painful Urination, Urgency and Urine Leakage. Musculoskeletal Present- Joint Pain and Joint Stiffness. Not Present- Back Pain, Muscle Pain, Muscle Weakness and Swelling of Extremities. Neurological Not Present- Decreased Memory, Fainting, Headaches, Numbness, Seizures, Tingling, Tremor, Trouble walking and Weakness. Psychiatric Not Present- Anxiety, Bipolar, Change in Sleep Pattern, Depression, Fearful and Frequent crying. Hematology Not Present- Blood Thinners, Easy Bruising, Excessive bleeding, Gland problems, HIV and Persistent Infections.  Vitals (David House CMA; 03/02/2017 8:25 AM) 03/02/2017 8:25 AM Weight: 374.6 lb Height: 69.2in Body Surface Area: 2.7 m Body Mass Index: 55 kg/m  Pulse: 96 (Regular)  BP: 132/82 (Sitting, Left Arm, Standard)      Physical Exam David Areola M. Sawyer Kahan MD; 03/02/2017 8:52 AM)  General Mental Status-Alert. General Appearance-Consistent with stated age. Hydration-Well hydrated. Voice-Normal. Note: severe obesity; evenly distributed  Head and Neck Head-normocephalic, atraumatic with no lesions or palpable  masses. Trachea-midline. Thyroid Gland Characteristics - normal size and consistency.  Eye Eyeball - Bilateral-Extraocular movements intact. Sclera/Conjunctiva - Bilateral-No scleral icterus.  ENMT Ears -Note:normal external ears.  Mouth and Throat -Note:lips intact.   Chest and Lung Exam Chest and lung exam reveals -quiet, even and easy respiratory effort with no use of accessory muscles and on auscultation, normal breath sounds, no adventitious sounds and normal vocal resonance. Inspection Chest Wall - Normal. Back - normal.  Breast - Did not examine.  Cardiovascular Cardiovascular examination reveals -normal heart sounds, regular rate and rhythm with no murmurs and normal pedal pulses bilaterally.  Abdomen Inspection Inspection of the abdomen reveals - No Hernias. Skin - Scar - no surgical scars. Palpation/Percussion Palpation and Percussion of the abdomen reveal - Soft, Non Tender, No Rebound tenderness, No Rigidity (guarding) and No hepatosplenomegaly. Auscultation Auscultation of the abdomen reveals - Bowel sounds normal.  Peripheral Vascular Upper Extremity Palpation - Pulses bilaterally normal.  Neurologic Neurologic evaluation reveals -alert and oriented x 3 with no impairment of recent or remote memory. Mental Status-Normal.  Neuropsychiatric The patient's mood and affect are described as -normal. Judgment and Insight-insight is appropriate concerning matters relevant to self.  Musculoskeletal Normal Exam - Left-Upper Extremity Strength Normal and Lower Extremity Strength Normal. Normal Exam - Right-Upper Extremity Strength Normal and Lower Extremity Strength Normal.  Lymphatic Head & Neck  General Head & Neck Lymphatics: Bilateral - Description - Normal. Axillary - Did not examine. Femoral & Inguinal - Did not examine.    Assessment & Plan David Areola M. Yanelli Zapanta MD; 03/02/2017 8:55 AM)  MORBID OBESITY WITH BMI OF 50.0-59.9,  ADULT (E66.01) Impression: We reviewed his workup including his labs and imaging. We discussed the findings of a small sliding hiatal hernia on upper GI. We discussed that we would look for one intraoperatively and found to have a clinically significant 1 I would recommend repair. We discussed what that would entail. We discussed the typical hospital course as well as  the typical recovery period I congratulated him on his weight loss so far. He was given his postoperative prescriptions today. All of his questions were asked and answered  Current Plans Started Zofran 4 MG Oral Tablet, 1 (one) Tablet every eight hours, as needed, #15, 03/02/2017, Ref. x1. Started OxyCODONE HCl 5 MG/5ML Oral Solution, 5-10 Milliliter every four hours, as needed, 100 Milliliter, 03/02/2017, No Refill. Started Protonix 40 MG Oral Tablet Delayed Release, 1 (one) Tablet daily, #30, 03/02/2017, Ref. x2. Pt Education - EMW_preopbariatric  HIATAL HERNIA (K44.9) Impression: We reviewed his upper endoscopy findings from 2016. If in fact the upper GI does confirm a hiatal hernia even if it is small I did recommend repairing during his sleeve gastrectomy in order to minimize potentially worsening reflux. I did inform the patient that even if we repaired the hiatal hernia it does not preclude the possibility of worsening heartburn.   BILATERAL CHRONIC KNEE PAIN (M25.561)  Mary Sella. Andrey Campanile, MD, FACS General, Bariatric, & Minimally Invasive Surgery [facility], [place]

## 2017-03-02 NOTE — Progress Notes (Signed)
BARIMAX BED WITH TRAPEZE ORDERED FROM [facility] WITH PORTABLE EQUIPMENT.

## 2017-03-05 ENCOUNTER — Encounter (HOSPITAL_COMMUNITY): Payer: Self-pay | Admitting: Emergency Medicine

## 2017-03-05 ENCOUNTER — Inpatient Hospital Stay (HOSPITAL_COMMUNITY): Payer: 59 | Admitting: Certified Registered Nurse Anesthetist

## 2017-03-05 ENCOUNTER — Encounter (HOSPITAL_COMMUNITY)
Admission: RE | Disposition: A | Discharge status: Home / Self Care | Payer: Self-pay | Source: Ambulatory Visit | Attending: General Surgery

## 2017-03-05 ENCOUNTER — Inpatient Hospital Stay (HOSPITAL_COMMUNITY)
Admission: RE | Admit: 2017-03-05 | Discharge: 2017-03-06 | DRG: 621 | Disposition: A | Discharge status: Home / Self Care | Payer: 59 | Source: Ambulatory Visit | Attending: General Surgery | Admitting: General Surgery

## 2017-03-05 ENCOUNTER — Other Ambulatory Visit: Payer: Self-pay

## 2017-03-05 DIAGNOSIS — E7800 Pure hypercholesterolemia, unspecified: Secondary | ICD-10-CM | POA: Diagnosis present

## 2017-03-05 DIAGNOSIS — M25561 Pain in right knee: Secondary | ICD-10-CM | POA: Diagnosis present

## 2017-03-05 DIAGNOSIS — K449 Diaphragmatic hernia without obstruction or gangrene: Secondary | ICD-10-CM | POA: Diagnosis present

## 2017-03-05 DIAGNOSIS — M25562 Pain in left knee: Secondary | ICD-10-CM | POA: Diagnosis present

## 2017-03-05 DIAGNOSIS — K219 Gastro-esophageal reflux disease without esophagitis: Secondary | ICD-10-CM | POA: Diagnosis present

## 2017-03-05 DIAGNOSIS — Z6841 Body Mass Index (BMI) 40.0 and over, adult: Secondary | ICD-10-CM

## 2017-03-05 DIAGNOSIS — G8929 Other chronic pain: Secondary | ICD-10-CM | POA: Diagnosis present

## 2017-03-05 DIAGNOSIS — Z9884 Bariatric surgery status: Secondary | ICD-10-CM

## 2017-03-05 DIAGNOSIS — E669 Obesity, unspecified: Secondary | ICD-10-CM | POA: Diagnosis present

## 2017-03-05 HISTORY — PX: LAPAROSCOPIC GASTRIC SLEEVE RESECTION: SHX5895

## 2017-03-05 HISTORY — PX: HIATAL HERNIA REPAIR: SHX195

## 2017-03-05 LAB — GLUCOSE, CAPILLARY: Glucose-Capillary: 131 mg/dL — ABNORMAL HIGH (ref 65–99)

## 2017-03-05 LAB — HEMOGLOBIN AND HEMATOCRIT, BLOOD
HCT: 39.4 % (ref 39.0–52.0)
HEMOGLOBIN: 12.6 g/dL — AB (ref 13.0–17.0)

## 2017-03-05 LAB — TYPE AND SCREEN
ABO/RH(D): O POS
Antibody Screen: NEGATIVE

## 2017-03-05 SURGERY — GASTRECTOMY, SLEEVE, LAPAROSCOPIC
Anesthesia: General

## 2017-03-05 MED ORDER — KCL IN DEXTROSE-NACL 20-5-0.45 MEQ/L-%-% IV SOLN
INTRAVENOUS | Status: DC
  Administered 2017-03-05: 17:00:00 via INTRAVENOUS
  Administered 2017-03-06: 1000 mL via INTRAVENOUS
  Filled 2017-03-05 (×3): qty 1000

## 2017-03-05 MED ORDER — SUGAMMADEX SODIUM 500 MG/5ML IV SOLN
INTRAVENOUS | Status: DC | PRN
  Administered 2017-03-05: 500 mg via INTRAVENOUS

## 2017-03-05 MED ORDER — SUCCINYLCHOLINE CHLORIDE 200 MG/10ML IV SOSY
PREFILLED_SYRINGE | INTRAVENOUS | Status: AC
  Filled 2017-03-05: qty 10

## 2017-03-05 MED ORDER — MORPHINE SULFATE (PF) 4 MG/ML IV SOLN
1.0000 mg | INTRAVENOUS | Status: DC | PRN
  Administered 2017-03-05: 3 mg via INTRAVENOUS
  Administered 2017-03-05: 2 mg via INTRAVENOUS
  Filled 2017-03-05: qty 1

## 2017-03-05 MED ORDER — SUCCINYLCHOLINE CHLORIDE 200 MG/10ML IV SOSY
PREFILLED_SYRINGE | INTRAVENOUS | Status: DC | PRN
  Administered 2017-03-05: 120 mg via INTRAVENOUS

## 2017-03-05 MED ORDER — DEXAMETHASONE SODIUM PHOSPHATE 10 MG/ML IJ SOLN
INTRAMUSCULAR | Status: AC
  Filled 2017-03-05: qty 1

## 2017-03-05 MED ORDER — CHLORHEXIDINE GLUCONATE 4 % EX LIQD: 60.0000 mL | Freq: Once | CUTANEOUS | Status: DC

## 2017-03-05 MED ORDER — ONDANSETRON HCL 4 MG/2ML IJ SOLN: 4.0000 mg | Freq: Four times a day (QID) | INTRAMUSCULAR | Status: DC | PRN

## 2017-03-05 MED ORDER — LIDOCAINE 2% (20 MG/ML) 5 ML SYRINGE
INTRAMUSCULAR | Status: DC | PRN
  Administered 2017-03-05: 1.5 mg/kg/h via INTRAVENOUS

## 2017-03-05 MED ORDER — ROCURONIUM BROMIDE 50 MG/5ML IV SOSY
PREFILLED_SYRINGE | INTRAVENOUS | Status: DC | PRN
  Administered 2017-03-05 (×2): 20 mg via INTRAVENOUS
  Administered 2017-03-05: 50 mg via INTRAVENOUS
  Administered 2017-03-05: 20 mg via INTRAVENOUS
  Administered 2017-03-05: 10 mg via INTRAVENOUS

## 2017-03-05 MED ORDER — OXYCODONE HCL 5 MG PO TABS: 5.0000 mg | ORAL_TABLET | Freq: Once | ORAL | Status: DC | PRN

## 2017-03-05 MED ORDER — BUPIVACAINE LIPOSOME 1.3 % IJ SUSP
20.0000 mL | Freq: Once | INTRAMUSCULAR | Status: DC
  Filled 2017-03-05: qty 20

## 2017-03-05 MED ORDER — DEXAMETHASONE SODIUM PHOSPHATE 4 MG/ML IJ SOLN
4.0000 mg | INTRAMUSCULAR | Status: AC
  Administered 2017-03-05: 4 mg via INTRAVENOUS

## 2017-03-05 MED ORDER — PROMETHAZINE HCL 25 MG/ML IJ SOLN: 12.5000 mg | Freq: Four times a day (QID) | INTRAMUSCULAR | Status: DC | PRN

## 2017-03-05 MED ORDER — GABAPENTIN 250 MG/5ML PO SOLN
200.0000 mg | Freq: Two times a day (BID) | ORAL | Status: DC
  Administered 2017-03-06: 200 mg via ORAL
  Filled 2017-03-05 (×2): qty 4

## 2017-03-05 MED ORDER — SIMETHICONE 80 MG PO CHEW
80.0000 mg | CHEWABLE_TABLET | Freq: Four times a day (QID) | ORAL | Status: DC | PRN
  Administered 2017-03-05 – 2017-03-06 (×3): 80 mg via ORAL
  Filled 2017-03-05 (×3): qty 1

## 2017-03-05 MED ORDER — HYDROMORPHONE HCL 1 MG/ML IJ SOLN
INTRAMUSCULAR | Status: AC
  Filled 2017-03-05: qty 1

## 2017-03-05 MED ORDER — ONDANSETRON HCL 4 MG/2ML IJ SOLN
INTRAMUSCULAR | Status: DC | PRN
  Administered 2017-03-05: 4 mg via INTRAVENOUS

## 2017-03-05 MED ORDER — PROPOFOL 10 MG/ML IV BOLUS
INTRAVENOUS | Status: AC
  Filled 2017-03-05: qty 40

## 2017-03-05 MED ORDER — FENTANYL CITRATE (PF) 250 MCG/5ML IJ SOLN
INTRAMUSCULAR | Status: AC
  Filled 2017-03-05: qty 5

## 2017-03-05 MED ORDER — SCOPOLAMINE 1 MG/3DAYS TD PT72
1.0000 | MEDICATED_PATCH | TRANSDERMAL | Status: DC
  Administered 2017-03-05: 1.5 mg via TRANSDERMAL
  Filled 2017-03-05: qty 1

## 2017-03-05 MED ORDER — ENOXAPARIN SODIUM 30 MG/0.3ML ~~LOC~~ SOLN
30.0000 mg | Freq: Two times a day (BID) | SUBCUTANEOUS | Status: DC
  Administered 2017-03-05 – 2017-03-06 (×2): 30 mg via SUBCUTANEOUS
  Filled 2017-03-05 (×2): qty 0.3

## 2017-03-05 MED ORDER — DIPHENHYDRAMINE HCL 50 MG/ML IJ SOLN
12.5000 mg | Freq: Three times a day (TID) | INTRAMUSCULAR | Status: DC | PRN
Start: 2017-03-05 — End: 2017-03-06

## 2017-03-05 MED ORDER — FENTANYL CITRATE (PF) 100 MCG/2ML IJ SOLN
INTRAMUSCULAR | Status: DC | PRN
  Administered 2017-03-05 (×4): 50 ug via INTRAVENOUS
  Administered 2017-03-05: 100 ug via INTRAVENOUS

## 2017-03-05 MED ORDER — MORPHINE SULFATE (PF) 4 MG/ML IV SOLN
INTRAVENOUS | Status: AC
  Filled 2017-03-05: qty 1

## 2017-03-05 MED ORDER — ACETAMINOPHEN 160 MG/5ML PO SOLN
650.0000 mg | Freq: Four times a day (QID) | ORAL | Status: DC
  Administered 2017-03-05 – 2017-03-06 (×2): 650 mg via ORAL
  Filled 2017-03-05 (×2): qty 20.3

## 2017-03-05 MED ORDER — OXYCODONE HCL 5 MG/5ML PO SOLN
5.0000 mg | Freq: Once | ORAL | Status: DC | PRN
  Filled 2017-03-05: qty 5

## 2017-03-05 MED ORDER — RINGERS IRRIGATION IR SOLN
Status: DC | PRN
  Administered 2017-03-05: 1000 mL

## 2017-03-05 MED ORDER — MIDAZOLAM HCL 5 MG/5ML IJ SOLN
INTRAMUSCULAR | Status: DC | PRN
  Administered 2017-03-05: 2 mg via INTRAVENOUS

## 2017-03-05 MED ORDER — FENTANYL CITRATE (PF) 100 MCG/2ML IJ SOLN
INTRAMUSCULAR | Status: AC
  Filled 2017-03-05: qty 2

## 2017-03-05 MED ORDER — ROCURONIUM BROMIDE 50 MG/5ML IV SOSY
PREFILLED_SYRINGE | INTRAVENOUS | Status: AC
  Filled 2017-03-05: qty 5

## 2017-03-05 MED ORDER — GABAPENTIN 300 MG PO CAPS
300.0000 mg | ORAL_CAPSULE | ORAL | Status: AC
  Administered 2017-03-05: 300 mg via ORAL
  Filled 2017-03-05: qty 1

## 2017-03-05 MED ORDER — SODIUM CHLORIDE 0.9 % IJ SOLN
INTRAMUSCULAR | Status: AC
  Filled 2017-03-05: qty 50

## 2017-03-05 MED ORDER — HYDROMORPHONE HCL 1 MG/ML IJ SOLN
0.2500 mg | INTRAMUSCULAR | Status: DC | PRN
  Administered 2017-03-05 (×4): 0.5 mg via INTRAVENOUS

## 2017-03-05 MED ORDER — BUPIVACAINE LIPOSOME 1.3 % IJ SUSP
INTRAMUSCULAR | Status: DC | PRN
  Administered 2017-03-05: 20 mL

## 2017-03-05 MED ORDER — ACETAMINOPHEN 500 MG PO TABS
1000.0000 mg | ORAL_TABLET | ORAL | Status: AC
  Administered 2017-03-05: 1000 mg via ORAL
  Filled 2017-03-05: qty 2

## 2017-03-05 MED ORDER — LIDOCAINE 2% (20 MG/ML) 5 ML SYRINGE
INTRAMUSCULAR | Status: AC
  Filled 2017-03-05: qty 5

## 2017-03-05 MED ORDER — CEFOTETAN DISODIUM-DEXTROSE 2-2.08 GM-%(50ML) IV SOLR
2.0000 g | INTRAVENOUS | Status: AC
  Administered 2017-03-05: 2 g via INTRAVENOUS
  Filled 2017-03-05: qty 50

## 2017-03-05 MED ORDER — LACTATED RINGERS IV SOLN
INTRAVENOUS | Status: DC
  Administered 2017-03-05 (×2): via INTRAVENOUS

## 2017-03-05 MED ORDER — EPHEDRINE SULFATE-NACL 50-0.9 MG/10ML-% IV SOSY
PREFILLED_SYRINGE | INTRAVENOUS | Status: DC | PRN
  Administered 2017-03-05: 15 mg via INTRAVENOUS

## 2017-03-05 MED ORDER — PANTOPRAZOLE SODIUM 40 MG IV SOLR
40.0000 mg | Freq: Every day | INTRAVENOUS | Status: DC
  Administered 2017-03-05: 40 mg via INTRAVENOUS
  Filled 2017-03-05: qty 40

## 2017-03-05 MED ORDER — SUGAMMADEX SODIUM 500 MG/5ML IV SOLN
INTRAVENOUS | Status: AC
  Filled 2017-03-05: qty 5

## 2017-03-05 MED ORDER — APREPITANT 40 MG PO CAPS
40.0000 mg | ORAL_CAPSULE | ORAL | Status: AC
  Administered 2017-03-05: 40 mg via ORAL
  Filled 2017-03-05: qty 1

## 2017-03-05 MED ORDER — SODIUM CHLORIDE 0.9 % IJ SOLN
INTRAMUSCULAR | Status: DC | PRN
  Administered 2017-03-05: 50 mL

## 2017-03-05 MED ORDER — PHENYLEPHRINE 40 MCG/ML (10ML) SYRINGE FOR IV PUSH (FOR BLOOD PRESSURE SUPPORT)
PREFILLED_SYRINGE | INTRAVENOUS | Status: DC | PRN
  Administered 2017-03-05 (×3): 120 ug via INTRAVENOUS

## 2017-03-05 MED ORDER — ONDANSETRON HCL 4 MG/2ML IJ SOLN
INTRAMUSCULAR | Status: AC
Start: 2017-03-05 — End: 2017-03-05
  Filled 2017-03-05: qty 2

## 2017-03-05 MED ORDER — MIDAZOLAM HCL 2 MG/2ML IJ SOLN
INTRAMUSCULAR | Status: AC
  Filled 2017-03-05: qty 2

## 2017-03-05 MED ORDER — LIDOCAINE HCL 2 % IJ SOLN
INTRAMUSCULAR | Status: AC
  Filled 2017-03-05: qty 20

## 2017-03-05 MED ORDER — KETAMINE HCL 10 MG/ML IJ SOLN
INTRAMUSCULAR | Status: DC | PRN
  Administered 2017-03-05: 35 mg via INTRAVENOUS

## 2017-03-05 MED ORDER — PROPOFOL 10 MG/ML IV BOLUS
INTRAVENOUS | Status: DC | PRN
  Administered 2017-03-05: 250 mg via INTRAVENOUS

## 2017-03-05 MED ORDER — PREMIER PROTEIN SHAKE
2.0000 [oz_av] | ORAL | Status: DC
  Administered 2017-03-06 (×3): 2 [oz_av] via ORAL

## 2017-03-05 MED ORDER — LIDOCAINE 2% (20 MG/ML) 5 ML SYRINGE
INTRAMUSCULAR | Status: DC | PRN
  Administered 2017-03-05: 60 mg via INTRAVENOUS

## 2017-03-05 MED ORDER — OXYCODONE HCL 5 MG/5ML PO SOLN
5.0000 mg | ORAL | Status: DC | PRN
  Administered 2017-03-05 – 2017-03-06 (×3): 10 mg via ORAL
  Filled 2017-03-05 (×3): qty 10

## 2017-03-05 MED ORDER — HEPARIN SODIUM (PORCINE) 5000 UNIT/ML IJ SOLN
5000.0000 [IU] | INTRAMUSCULAR | Status: AC
  Administered 2017-03-05: 5000 [IU] via SUBCUTANEOUS
  Filled 2017-03-05: qty 1

## 2017-03-05 SURGICAL SUPPLY — 67 items
APPLICATOR COTTON TIP 6IN STRL (MISCELLANEOUS) IMPLANT
APPLIER CLIP ROT 10 11.4 M/L (STAPLE)
APPLIER CLIP ROT 13.4 12 LRG (CLIP)
BANDAGE ADH SHEER 1  50/CT (GAUZE/BANDAGES/DRESSINGS) ×24 IMPLANT
BENZOIN TINCTURE PRP APPL 2/3 (GAUZE/BANDAGES/DRESSINGS) ×4 IMPLANT
BLADE SURG SZ11 CARB STEEL (BLADE) ×4 IMPLANT
CABLE HIGH FREQUENCY MONO STRZ (ELECTRODE) ×4 IMPLANT
CHLORAPREP W/TINT 26ML (MISCELLANEOUS) ×8 IMPLANT
CLIP APPLIE ROT 10 11.4 M/L (STAPLE) IMPLANT
CLIP APPLIE ROT 13.4 12 LRG (CLIP) IMPLANT
CLOSURE WOUND 1/2 X4 (GAUZE/BANDAGES/DRESSINGS) ×1
DECANTER SPIKE VIAL GLASS SM (MISCELLANEOUS) ×4 IMPLANT
DEVICE SUT QUICK LOAD TK 5 (STAPLE) ×6 IMPLANT
DEVICE SUT TI-KNOT TK 5X26 (MISCELLANEOUS) ×3 IMPLANT
DEVICE TI KNOT TK5 (MISCELLANEOUS) ×1
DISSECTOR BLUNT TIP ENDO 5MM (MISCELLANEOUS) ×4 IMPLANT
DRAPE UTILITY XL STRL (DRAPES) ×8 IMPLANT
ELECT L-HOOK LAP 45CM DISP (ELECTROSURGICAL)
ELECT PENCIL ROCKER SW 15FT (MISCELLANEOUS) IMPLANT
ELECT REM PT RETURN 15FT ADLT (MISCELLANEOUS) ×4 IMPLANT
ELECTRODE L-HOOK LAP 45CM DISP (ELECTROSURGICAL) IMPLANT
GAUZE SPONGE 2X2 8PLY STRL LF (GAUZE/BANDAGES/DRESSINGS) IMPLANT
GAUZE SPONGE 4X4 12PLY STRL (GAUZE/BANDAGES/DRESSINGS) IMPLANT
GLOVE BIO SURGEON STRL SZ7.5 (GLOVE) ×4 IMPLANT
GLOVE INDICATOR 8.0 STRL GRN (GLOVE) ×4 IMPLANT
GOWN STRL REUS W/TWL XL LVL3 (GOWN DISPOSABLE) ×12 IMPLANT
GRASPER SUT TROCAR 14GX15 (MISCELLANEOUS) IMPLANT
HOVERMATT SINGLE USE (MISCELLANEOUS) ×4 IMPLANT
KIT BASIN OR (CUSTOM PROCEDURE TRAY) ×4 IMPLANT
MARKER SKIN DUAL TIP RULER LAB (MISCELLANEOUS) ×4 IMPLANT
NEEDLE SPNL 22GX3.5 QUINCKE BK (NEEDLE) ×4 IMPLANT
PACK UNIVERSAL I (CUSTOM PROCEDURE TRAY) ×4 IMPLANT
QUICK LOAD TK 5 (STAPLE) ×2
RELOAD STAPLER 60MM BLK (STAPLE) ×4 IMPLANT
RELOAD STAPLER BLUE 60MM (STAPLE) ×6 IMPLANT
RELOAD STAPLER GOLD 60MM (STAPLE) ×2 IMPLANT
RELOAD STAPLER GREEN 60MM (STAPLE) ×2 IMPLANT
SCISSORS METZENBAUM CVD 44CM (INSTRUMENTS) IMPLANT
SEALANT SURGICAL APPL DUAL CAN (MISCELLANEOUS) IMPLANT
SET IRRIG TUBING LAPAROSCOPIC (IRRIGATION / IRRIGATOR) ×4 IMPLANT
SHEARS HARMONIC ACE PLUS 45CM (MISCELLANEOUS) ×4 IMPLANT
SLEEVE GASTRECTOMY 40FR VISIGI (MISCELLANEOUS) ×4 IMPLANT
SLEEVE XCEL OPT CAN 5 100 (ENDOMECHANICALS) ×12 IMPLANT
SOLUTION ANTI FOG 6CC (MISCELLANEOUS) ×4 IMPLANT
SPONGE GAUZE 2X2 STER 10/PKG (GAUZE/BANDAGES/DRESSINGS)
SPONGE LAP 18X18 X RAY DECT (DISPOSABLE) ×4 IMPLANT
STAPLER ECHELON BIOABSB 60 FLE (MISCELLANEOUS) ×12 IMPLANT
STAPLER ECHELON LONG 60 440 (INSTRUMENTS) IMPLANT
STAPLER RELOAD 60MM BLK (STAPLE) ×8
STAPLER RELOAD BLUE 60MM (STAPLE) ×12
STAPLER RELOAD GOLD 60MM (STAPLE) ×4
STAPLER RELOAD GREEN 60MM (STAPLE) ×4
STRIP CLOSURE SKIN 1/2X4 (GAUZE/BANDAGES/DRESSINGS) ×3 IMPLANT
SUT MNCRL AB 4-0 PS2 18 (SUTURE) ×4 IMPLANT
SUT SURGIDAC NAB ES-9 0 48 120 (SUTURE) ×8 IMPLANT
SUT VICRYL 0 TIES 12 18 (SUTURE) ×4 IMPLANT
SYR 20CC LL (SYRINGE) ×4 IMPLANT
SYR 50ML LL SCALE MARK (SYRINGE) ×4 IMPLANT
TOWEL OR 17X26 10 PK STRL BLUE (TOWEL DISPOSABLE) ×4 IMPLANT
TOWEL OR NON WOVEN STRL DISP B (DISPOSABLE) ×4 IMPLANT
TRAY FOLEY W/METER SILVER 16FR (SET/KITS/TRAYS/PACK) IMPLANT
TROCAR BLADELESS 15MM (ENDOMECHANICALS) ×4 IMPLANT
TROCAR BLADELESS OPT 5 100 (ENDOMECHANICALS) ×4 IMPLANT
TUBING CONNECTING 10 (TUBING) ×3 IMPLANT
TUBING CONNECTING 10' (TUBING) ×1
TUBING ENDO SMARTCAP (MISCELLANEOUS) ×4 IMPLANT
TUBING INSUF HEATED (TUBING) ×4 IMPLANT

## 2017-03-05 NOTE — Anesthesia Preprocedure Evaluation (Signed)
Anesthesia Evaluation  Patient identified by MRN, date of birth, ID band Patient awake    Reviewed: Allergy & Precautions, H&P , NPO status , Patient's Chart, lab work & pertinent test results  Airway Mallampati: II   Neck ROM: full    Dental   Pulmonary neg pulmonary ROS,    breath sounds clear to auscultation       Cardiovascular negative cardio ROS   Rhythm:regular Rate:Normal     Neuro/Psych    GI/Hepatic GERD  ,  Endo/Other  Morbid obesity  Renal/GU      Musculoskeletal   Abdominal   Peds  Hematology   Anesthesia Other Findings   Reproductive/Obstetrics                             Anesthesia Physical Anesthesia Plan  ASA: II  Anesthesia Plan: General   Post-op Pain Management:    Induction: Intravenous  PONV Risk Score and Plan: 2 and Ondansetron, Dexamethasone, Midazolam and Treatment may vary due to age or medical condition  Airway Management Planned: Oral ETT  Additional Equipment:   Intra-op Plan:   Post-operative Plan: Extubation in OR  Informed Consent: I have reviewed the patients History and Physical, chart, labs and discussed the procedure including the risks, benefits and alternatives for the proposed anesthesia with the patient or authorized representative who has indicated his/her understanding and acceptance.     Plan Discussed with: CRNA, Anesthesiologist and Surgeon  Anesthesia Plan Comments:         Anesthesia Quick Evaluation

## 2017-03-05 NOTE — Anesthesia Postprocedure Evaluation (Signed)
Anesthesia Post Note  Patient: David House  Procedure(s) Performed: LAPAROSCOPIC GASTRIC SLEEVE RESECTION WITH UPPER ENDO (N/A ) HERNIA REPAIR HIATAL     Patient location during evaluation: PACU Anesthesia Type: General Level of consciousness: awake and alert Pain management: pain level controlled Vital Signs Assessment: post-procedure vital signs reviewed and stable Respiratory status: spontaneous breathing, nonlabored ventilation, respiratory function stable and patient connected to nasal cannula oxygen Cardiovascular status: blood pressure returned to baseline and stable Postop Assessment: no apparent nausea or vomiting Anesthetic complications: no    Last Vitals:  Vitals:   03/05/17 1500 03/05/17 1515  BP: 131/69 127/66  Pulse: 95 97  Resp: (!) 24 (!) 25  Temp:    SpO2: 94% 94%    Last Pain:  Vitals:   03/05/17 1515  TempSrc:   PainSc: Asleep                 [name] S

## 2017-03-05 NOTE — Anesthesia Procedure Notes (Signed)
Procedure Name: Intubation Date/Time: 03/05/2017 11:10 AM Performed by: Lavina Hamman, CRNA Pre-anesthesia Checklist: Patient identified, Emergency Drugs available, Suction available, Patient being monitored and Timeout performed Patient Re-evaluated:Patient Re-evaluated prior to induction Oxygen Delivery Method: Circle system utilized Preoxygenation: Pre-oxygenation with 100% oxygen Induction Type: IV induction Ventilation: Mask ventilation without difficulty Laryngoscope Size: Mac and 4 Grade View: Grade I Tube type: Oral Tube size: 7.5 mm Number of attempts: 1 Airway Equipment and Method: Stylet Placement Confirmation: ETT inserted through vocal cords under direct vision,  positive ETCO2,  CO2 detector and breath sounds checked- equal and bilateral Secured at: 22 cm Tube secured with: Tape Dental Injury: Teeth and Oropharynx as per pre-operative assessment

## 2017-03-05 NOTE — Discharge Instructions (Signed)
° ° ° °GASTRIC BYPASS/SLEEVE ° Home Care Instructions ° ° These instructions are to help you care for yourself when you go home. ° °Call: If you have any problems. °• Call [phone] and ask for the surgeon on call °• If you need immediate help, come to the ER at [facility].  °• Tell the ER staff that you are a new post-op gastric bypass or gastric sleeve patient °  °Signs and symptoms to report: • Severe vomiting or nausea °o If you cannot keep down clear liquids for longer than 1 day, call your surgeon  °• Abdominal pain that does not get better after taking your pain medication °• Fever over 100.4° F with chills °• Heart beating over 100 beats a minute °• Shortness of breath at rest °• Chest pain °•  Redness, swelling, drainage, or foul odor at incision (surgical) sites °•  If your incisions open or pull apart °• Swelling or pain in calf (lower leg) °• Diarrhea (Loose bowel movements that happen often), frequent watery, uncontrolled bowel movements °• Constipation, (no bowel movements for 3 days) if this happens: Pick one °o Milk of Magnesia, 2 tablespoons by mouth, 3 times a day for 2 days if needed °o Stop taking Milk of Magnesia once you have a bowel movement °o Call your doctor if constipation continues °Or °o Miralax  (instead of Milk of Magnesia) following the label instructions °o Stop taking Miralax once you have a bowel movement °o Call your doctor if constipation continues °• Anything you think is not normal °  °Normal side effects after surgery: • Unable to sleep at night or unable to focus °• Irritability or moody °• Being tearful (crying) or depressed °These are common complaints, possibly related to your anesthesia medications that put you to sleep, stress of surgery, and change in lifestyle.  This usually goes away a few weeks after surgery.  If these feelings continue, call your primary care doctor. °  °Wound Care: You may have surgical glue, steri-strips, or staples over your incisions after  surgery °• Surgical glue:  Looks like a clear film over your incisions and will wear off a little at a time °• Steri-strips: Strips of tape over your incisions. You may notice a yellowish color on the skin under the steri-strips. This is used to make the   steri-strips stick better. Do not pull the steri-strips off - let them fall off °• Staples: Staples may be removed before you leave the hospital °o If you go home with staples, call [facility], [phone] at for an appointment with your surgeon’s nurse to have staples removed 10 days after surgery. °• Showering: You may shower two (2) days after your surgery unless your surgeon tells you differently °o Wash gently around incisions with warm soapy water, rinse well, and gently pat dry  °o No tub baths until staples are removed, steri-strips fall off or glue is gone.  °  °Medications: • Medications should be liquid or crushed if larger than the size of a dime °• Extended release pills (medication that release a little bit at a time through the day) should NOT be crushed or cut. (examples include XL, ER, DR, SR) °• Depending on the size and number of medications you take, you may need to space (take a few throughout the day)/change the time you take your medications so that you do not over-fill your pouch (smaller stomach) °• Make sure you follow-up with your primary care doctor to   make medication changes needed during rapid weight loss and life-style changes °• If you have diabetes, follow up with the doctor that orders your diabetes medication(s) within one week after surgery and check your blood sugar regularly. °• Do not drive while taking prescription pain medication  °• It is ok to take Tylenol by the bottle instructions with your pain medicine or instead of your pain medicine as needed.  DO NOT TAKE NSAIDS (EXAMPLES OF NSAIDS:  IBUPROFREN/ NAPROXEN)  °Diet:                    First 2 Weeks ° You will see the dietician t about two (2) weeks  after your surgery. The dietician will increase the types of foods you can eat if you are handling liquids well: °• If you have severe vomiting or nausea and cannot keep down clear liquids lasting longer than 1 day, call your surgeon @ ([phone]) °Protein Shake °• Drink at least 2 ounces of shake 5-6 times per day °• Each serving of protein shakes (usually 8 - 12 ounces) should have: °o 15 grams of protein  °o And no more than 5 grams of carbohydrate  °• Goal for protein each day: °o Men = 80 grams per day °o Women = 60 grams per day °• Protein powder may be added to fluids such as non-fat milk or Lactaid milk or unsweetened Soy/Almond milk (limit to 35 grams added protein powder per serving) ° °Hydration °• Slowly increase the amount of water and other clear liquids as tolerated (See Acceptable Fluids) °• Slowly increase the amount of protein shake as tolerated  °•  Sip fluids slowly and throughout the day.  Do not use straws. °• May use sugar substitutes in small amounts (no more than 6 - 8 packets per day; i.e. Splenda) ° °Fluid Goal °• The first goal is to drink at least 8 ounces of protein shake/drink per day (or as directed by the nutritionist); some examples of protein shakes are Syntrax Nectar, Adkins Advantage, EAS Edge HP, and Unjury. See handout from pre-op Bariatric Education Class: °o Slowly increase the amount of protein shake you drink as tolerated °o You may find it easier to slowly sip shakes throughout the day °o It is important to get your proteins in first °• Your fluid goal is to drink 64 - 100 ounces of fluid daily °o It may take a few weeks to build up to this °• 32 oz (or more) should be clear liquids  °And  °• 32 oz (or more) should be full liquids (see below for examples) °• Liquids should not contain sugar, caffeine, or carbonation ° °Clear Liquids: °• Water or Sugar-free flavored water (i.e. Fruit H2O, Propel) °• Decaffeinated coffee or tea (sugar-free) °• Crystal Lite, Wyler’s Lite,  Minute Maid Lite °• Sugar-free Jell-O °• Bouillon or broth °• Sugar-free Popsicle:   *Less than 20 calories each; Limit 1 per day ° °Full Liquids: °Protein Shakes/Drinks + 2 choices per day of other full liquids °• Full liquids must be: °o No More Than 15 grams of Carbs per serving  °o No More Than 3 grams of Fat per serving °• Strained low-fat cream soup (except Cream of Potato or Tomato) °• Non-Fat milk °• Fat-free Lactaid Milk °• Unsweetened Soy Or Unsweetened Almond Milk °• Low Sugar yogurt (Dannon Lite & Fit, Greek yogurt; Oikos Triple Zero; Chobani Simply 100; Yoplait 100 calorie Greek - No Fruit on the Bottom) ° °  °Vitamins   and Minerals • Start 1 day after surgery unless otherwise directed by your surgeon °• 2 Chewable Bariatric Specific Multivitamin / Multimineral Supplement with iron (Example: Bariatric Advantage Multi EA) °• Chewable Calcium with Vitamin D-3 °(Example: 3 Chewable Calcium Plus 600 with Vitamin D-3) °o Take 500 mg three (3) times a day for a total of 1500 mg each day °o Do not take all 3 doses of calcium at one time as it may cause constipation, and you can only absorb 500 mg  at a time  °o Do not mix multivitamins containing iron with calcium supplements; take 2 hours apart °• Menstruating women and those with a history of anemia (a blood disease that causes weakness) may need extra iron °o Talk with your doctor to see if you need more iron °• Do not stop taking or change any vitamins or minerals until you talk to your dietitian or surgeon °• Your Dietitian and/or surgeon must approve all vitamin and mineral supplements °  °Activity and Exercise: Limit your physical activity as instructed by your doctor.  It is important to continue walking at home.  During this time, use these guidelines: °• Do not lift anything greater than ten (10) pounds for at least two (2) weeks °• Do not go back to work or drive until your surgeon says you can °• You may have sex when you feel comfortable  °o It is  VERY important for male patients to use a reliable birth control method; fertility often increases after surgery  °o All hormonal birth control will be ineffective for 30 days after surgery due to medications given during surgery a barrier method must be used. °o Do not get pregnant for at least 18 months °• Start exercising as soon as your doctor tells you that you can °o Make sure your doctor approves any physical activity °• Start with a simple walking program °• Walk 5-15 minutes each day, 7 days per week.  °• Slowly increase until you are walking 30-45 minutes per day °Consider joining our BELT program. [phone] or email [email or web address] °  °Special Instructions Things to remember: °• Use your CPAP when sleeping if this applies to you ° °• [facility] has two free Bariatric Surgery Support Groups that meet monthly °o The 3rd Thursday of each month, 6 pm, [facility]  °o The 2nd Friday of each month, 11:45 am in the private dining room in the basement of  °• It is very important to keep all follow up appointments with your surgeon, dietitian, primary care physician, and behavioral health practitioner °• Routine follow up schedule with your surgeon include appointments at 2-3 weeks, 6-8 weeks, 6 months, and 1 year at a minimum.  Your surgeon may request to see you more often.   °o After the first year, please follow up with your bariatric surgeon and dietitian at least once a year in order to maintain best weight loss results °[facility]: [phone] °[facility]: [phone] °Bariatric Nurse Coordinator: [phone] °  °   Reviewed and Endorsed  °by [facility] Patient Education Committee, June, 2016 °Edits Approved: Aug, 2018 ° ° ° °

## 2017-03-05 NOTE — Progress Notes (Signed)
Pt wishes to be a private pt, pt speaking with admitting representative at this time.

## 2017-03-05 NOTE — Interval H&P Note (Signed)
History and Physical Interval Note:  03/05/2017 10:26 AM  David House  has presented today for surgery, with the diagnosis of MORBID OBESITY  The various methods of treatment have been discussed with the patient and family. After consideration of risks, benefits and other options for treatment, the patient has consented to  Procedure(s): LAPAROSCOPIC GASTRIC SLEEVE RESECTION WITH UPPER ENDO (N/A) as a surgical intervention .  The patient's history has been reviewed, patient examined, no change in status, stable for surgery.  I have reviewed the patient's chart and labs.  Questions were answered to the patient's satisfaction.    Possible hiatal hernia repair    Mary Sella. Andrey Campanile, MD, FACS General, Bariatric, & Minimally Invasive Surgery [facility]  Gaynelle Adu

## 2017-03-05 NOTE — Transfer of Care (Signed)
Immediate Anesthesia Transfer of Care Note  Patient: David House  Procedure(s) Performed: LAPAROSCOPIC GASTRIC SLEEVE RESECTION WITH UPPER ENDO (N/A ) HERNIA REPAIR HIATAL  Patient Location: PACU  Anesthesia Type:General  Level of Consciousness: drowsy and patient cooperative  Airway & Oxygen Therapy: Patient Spontanous Breathing and Patient connected to face mask oxygen  Post-op Assessment: Report given to RN and Post -op Vital signs reviewed and stable  Post vital signs: Reviewed and stable  Last Vitals:  Vitals:   03/05/17 0843 03/05/17 1347  BP: (!) 141/70 (!) 147/75  Pulse: 88 (!) 108  Resp: 18 (!) 42  Temp: 36.7 C   SpO2: 100% 100%    Last Pain:  Vitals:   03/05/17 0843  TempSrc: Oral      Patients Stated Pain Goal: 4 (03/05/17 0853)  Complications: No apparent anesthesia complications

## 2017-03-05 NOTE — Op Note (Signed)
03/05/2017 David House 1990/09/21 [id number]   PRE-OPERATIVE DIAGNOSIS:  Principal Problem:   Morbid obesity BMI 54 GERD (gastroesophageal reflux disease)   Bilateral chronic knee pain Hiatal hernia    POST-OPERATIVE DIAGNOSIS:  same  PROCEDURE:  Procedure(s): LAPAROSCOPIC SLEEVE GASTRECTOMY WITH HIATAL HERNIA REPAIR UPPER GI ENDOSCOPY  SURGEON:  Surgeon(s): Atilano Ina, MD FACS FASMBS  ASSISTANTS: Ovidio Kin MD FACS  ANESTHESIA:   general  DRAINS: none   BOUGIE: 40 fr ViSiGi  LOCAL MEDICATIONS USED:   Exparel  EBL: minimal  SPECIMEN:  Source of Specimen:  Greater curvature of stomach  DISPOSITION OF SPECIMEN:  PATHOLOGY  COUNTS:  YES  INDICATION FOR PROCEDURE: This is a very pleasant 27 y.o.-year-old morbidly obese male who has had unsuccessful attempts for sustained weight loss. The patient presents today for a planned laparoscopic sleeve gastrectomy with upper endoscopy. We have discussed the risk and benefits of the procedure extensively preoperatively. Please see my separate notes.  PROCEDURE: After obtaining informed consent and receiving 5000 units of subcutaneous heparin, the patient was brought to the operating room at [facility] and placed supine on the operating room table. General endotracheal anesthesia was established. Sequential compression devices were placed. A orogastric tube was placed. The patient's abdomen was prepped and draped in the usual standard surgical fashion. The patient received preoperative IV antibiotic. A surgical timeout was performed. ERAS protocol used.   Access to the abdomen was achieved using a 5 mm 0 laparoscope thru a 5 mm trocar In the left upper Quadrant 2 fingerbreadths below the left subcostal margin using the Optiview technique. Pneumoperitoneum was smoothly established up to 15 mm of mercury. The laparoscope was advanced and the abdominal cavity was surveilled. The patient was then placed in reverse  Trendelenburg.   A 5 mm trocar was placed slightly above and to the left of the umbilicus under direct visualization.  The [facility] liver retractor was placed under the left lobe of the liver through a 5 mm trocar incision site in the subxiphoid position. A 5 mm trocar was placed in the lateral right upper quadrant along with a 15 mm trocar in the mid right abdomen. A final 5 mm trocar was placed in the lateral LUQ.  All under direct visualization after exparel had been infiltrated in bilateral lateral upper abdominal walls as a TAP block.  The stomach was inspected. It was completely decompressed and the orogastric tube was removed.  There is a small anterior dimple that was obviously visible. His preop UGI showed a small sliding hernia. The calibration tube was placed in the oropharynx and guided down into the stomach by the CRNA. 10 mL of air was insufflated into the calibration balloon. The calibration tubing was then gently pulled back by the CRNA and it slid past the GE junction. At this point the calibration tubing was desufflated and pulled back into the esophagus. This confirmed my suspicion of a clinically significant hiatal hernia. The gastrohepatic ligament was incised with harmonic scalpel. The right crus was identified. We identified the crossing fat along the right crus. The adipose tissue just above this area was incised with harmonic scalpel. I then bluntly dissected out this area and identified the left crus. There was evidence of a hiatal hernia. I then mobilized the esophagus. The left and right crus were further mobilized with blunt dissection. I reduced a plug of herniated fat and discarded it. There was a fair amount of torque in the abdominal wall given his abdominal wall  thickness.  I was then able to reapproximate the left and right crus with 0 Ethibond using an Endostitch suture device and securing it with a titanium tyknot. I placed a second suture in a similar fashion. We then had  the CRNA readvanced the calibration tubing back into the stomach. 10 mL of air was insufflated into the calibration tube balloon. The calibration tube was then gently pulled back and there was resistance at the GE junction. The tube did not slide back up into the esophagus. At this point the calibration tubing was deflated and removed from the patient's body.   We identified the pylorus and measured 6 cm proximal to the pylorus and identified an area of where we would start taking down the short gastric vessels. Harmonic scalpel was used to take down the short gastric vessels along the greater curvature of the stomach. We were able to enter the lesser sac. We continued to march along the greater curvature of the stomach taking down the short gastrics. As we approached the gastrosplenic ligament we took care in this area not to injure the spleen. We were able to take down the entire gastrosplenic ligament. We then mobilized the fundus away from the left crus of diaphragm. There were not any significant posterior gastric avascular attachments. This left the stomach completely mobilized. No vessels had been taken down along the lesser curvature of the stomach.  We then reidentified the pylorus. A 40Fr ViSiGi was then placed in the oropharynx and advanced down into the stomach and placed in the distal antrum and positioned along the lesser curvature. It was placed under suction which secured the 40Fr ViSiGi in place along the lesser curve. His stomach appeared thickened. Then using the Ethicon echelon 60 mm stapler with a black load with Seamguard, I placed a stapler along the antrum approximately 5 cm from the pylorus. The stapler was angled so that there is ample room at the angularis incisura. I then fired the first staple load after inspecting it posteriorly to ensure adequate space both anteriorly and posteriorly. At this point I still was not completely past the angularis so with another black load with  Seamguard, I placed the stapler in position just inside the prior stapleline. We then rotated the stomach to insure that there was adequate anteriorly as well as posteriorly. The stapler was then fired.  At this point I started using 60 mm green load staple cartridges with Seamguard. The echelon stapler was then repositioned with a 60 mm blue load with Seamguard and we continued to march up along the ViSiGi. My assistant was holding traction along the greater curvature stomach along the cauterized short gastric vessels ensuring that the stomach was symmetrically retracted. Prior to each firing of the staple, we rotated the stomach to ensure that there is adequate stomach left.  As we approached the fundus, I used 60 mm blue cartridge with Seamguard aiming  lateral to the GE junction after mobilizing some of the esophageal fat pad.  The sleeve was inspected. There is no evidence of cork screw. The staple line appeared hemostatic. The CRNA inflated the ViSiGi to the green zone and the upper abdomen was flooded with saline. There were no bubbles. The sleeve was decompressed and the ViSiGi removed. My assistant scrubbed out and performed an upper endoscopy. The sleeve easily distended with air and the scope was easily advanced to the pylorus. There is no evidence of internal bleeding or cork screwing. There was no narrowing at the angularis.  There is no evidence of bubbles. Please see his operative note for further details. The gastric sleeve was decompressed and the endoscope was removed.  The greater curvature the stomach was grasped with a laparoscopic grasper and removed from the 15 mm trocar site.  The liver retractor was removed. I then closed the 15 mm trocar site with 2 interrupted 0 Vicryl sutures through the fascia using the endoclose. The closure was viewed laparoscopically and it was airtight. Remaining Exparel was then infiltrated in the preperitoneal spaces around the trocar sites. Pneumoperitoneum was  released. All trocar sites were closed with a 4-0 Monocryl in a subcuticular fashion followed by the application of benzoin, steri-strips, and bandaids. The patient was extubated and taken to the recovery room in stable condition. All needle, instrument, and sponge counts were correct x2. There are no immediate complications  (2) 60 mm black with seamguard (1) 60 mm green with Seamguard (3) 60 mm blue with 2 seamguard  PLAN OF CARE: Admit to inpatient   PATIENT DISPOSITION:  PACU - hemodynamically stable.   Delay start of Pharmacological VTE agent (>24hrs) due to surgical blood loss or risk of bleeding:  no  Mary Sella. Andrey Campanile, MD, FACS FASMBS General, Bariatric, & Minimally Invasive Surgery [facility], [place]

## 2017-03-05 NOTE — Op Note (Signed)
Name:  Jionni Helming MRN: [id number] Date of Surgery: 03/05/2017  Preop Diagnosis:  Morbid Obesity  Postop Diagnosis:  Morbid Obesity (Weight - 374, BMI - 55), S/P Gastric Sleeve resection  Procedure:  Upper endoscopy  (Intraoperative)  Surgeon:  Ovidio Kin, M.D.  Anesthesia:  GET  Indications for procedure: Rodgerick Gilliand is a 27 y.o. male whose primary care physician is Devra Dopp, MD and has completed a gastric sleeve resection today for weight loss by Dr. Andrey Campanile.  I am doing an intraoperative upper endoscopy to evaluate the gastric pouch after the sleeve gastrectomy.  Operative Note: The patient is under general anesthesia.  Dr. Andrey Campanile is laparoscoping the patient while I do an upper endoscopy to evaluate the stomach pouch.  With the patient intubated, I passed the Pentax upper endoscope without difficulty down the esophagus.  The esophagus was unremarkable.  The esophago-gastric junction was at 38 cm.    The mucosa of the stomach looked viable and the staple line was intact without bleeding.  I advanced the scope to the pylorus, but did not go through it.  While I insufflated the stomach pouch with air, Dr. Andrey Campanile  flooded the upper abdomen with saline to put the gastric pouch under saline.  There was no bubbling or evidence of a leak.  There was no evidence of narrowing of the pouch and the gastric sleeve looked tubular.  The scope was then withdrawn.  The esophagus was unremarkable and the patient tolerated the endoscopy without difficulty.  Ovidio Kin, MD, [facility] Pager: [phone] Office phone:  [phone]

## 2017-03-06 ENCOUNTER — Encounter (HOSPITAL_COMMUNITY): Payer: Self-pay | Admitting: General Surgery

## 2017-03-06 LAB — CBC WITH DIFFERENTIAL/PLATELET
BASOS ABS: 0 10*3/uL (ref 0.0–0.1)
Basophils Relative: 0 %
EOS ABS: 0 10*3/uL (ref 0.0–0.7)
EOS PCT: 0 %
HCT: 37.9 % — ABNORMAL LOW (ref 39.0–52.0)
Hemoglobin: 11.8 g/dL — ABNORMAL LOW (ref 13.0–17.0)
LYMPHS ABS: 1.6 10*3/uL (ref 0.7–4.0)
LYMPHS PCT: 15 %
MCH: 26.3 pg (ref 26.0–34.0)
MCHC: 31.1 g/dL (ref 30.0–36.0)
MCV: 84.4 fL (ref 78.0–100.0)
MONO ABS: 0.9 10*3/uL (ref 0.1–1.0)
Monocytes Relative: 8 %
Neutro Abs: 8.6 10*3/uL — ABNORMAL HIGH (ref 1.7–7.7)
Neutrophils Relative %: 77 %
PLATELETS: 294 10*3/uL (ref 150–400)
RBC: 4.49 MIL/uL (ref 4.22–5.81)
RDW: 16.4 % — AB (ref 11.5–15.5)
WBC: 11.1 10*3/uL — ABNORMAL HIGH (ref 4.0–10.5)

## 2017-03-06 LAB — COMPREHENSIVE METABOLIC PANEL
ALT: 61 U/L (ref 17–63)
AST: 76 U/L — ABNORMAL HIGH (ref 15–41)
Albumin: 3.8 g/dL (ref 3.5–5.0)
Alkaline Phosphatase: 69 U/L (ref 38–126)
Anion gap: 5 (ref 5–15)
BUN: 9 mg/dL (ref 6–20)
CHLORIDE: 107 mmol/L (ref 101–111)
CO2: 26 mmol/L (ref 22–32)
Calcium: 8.7 mg/dL — ABNORMAL LOW (ref 8.9–10.3)
Creatinine, Ser: 0.92 mg/dL (ref 0.61–1.24)
GFR calc non Af Amer: >60 mL/min (ref >60)
Glucose, Bld: 120 mg/dL — ABNORMAL HIGH (ref 65–99)
POTASSIUM: 3.8 mmol/L (ref 3.5–5.1)
Sodium: 138 mmol/L (ref 135–145)
Total Bilirubin: 0.6 mg/dL (ref 0.3–1.2)
Total Protein: 7.5 g/dL (ref 6.5–8.1)

## 2017-03-06 MED ORDER — OXYCODONE HCL 5 MG/5ML PO SOLN: 5.0000 mg | ORAL | 0 refills | Status: DC | PRN

## 2017-03-06 MED ORDER — OXYCODONE HCL 5 MG/5ML PO SOLN: 5.0000 mg | ORAL | 0 refills | Status: AC | PRN

## 2017-03-06 MED FILL — oxyCODONE HCL 5 MG/5ML SOLN: 5 | 3 days supply | Qty: 100 | Fill #0

## 2017-03-06 NOTE — Plan of Care (Signed)
Nutrition Education Note  Received consult for diet education per DROP protocol.   Discussed 2 week post op diet with pt. Emphasized that liquids must be non carbonated, non caffeinated, and sugar free. Fluid goals discussed. Pt to follow up with outpatient bariatric RD for further diet progression after 2 weeks. Multivitamins and minerals also reviewed. Teach back method used, pt expressed understanding, expect good compliance.   Diet: First 2 Weeks  You will see the nutritionist about two (2) weeks after your surgery. The nutritionist will increase the types of foods you can eat if you are handling liquids well:  If you have severe vomiting or nausea and cannot handle clear liquids lasting longer than 1 day, call your surgeon  Protein Shake  Drink at least 2 ounces of shake 5-6 times per day  Each serving of protein shakes (usually 8 - 12 ounces) should have a minimum of:  15 grams of protein  And no more than 5 grams of carbohydrate  Goal for protein each day:  Men = 80 grams per day  Women = 60 grams per day  Protein powder may be added to fluids such as non-fat milk or Lactaid milk or Soy milk (limit to 35 grams added protein powder per serving)   Hydration  Slowly increase the amount of water and other clear liquids as tolerated (See Acceptable Fluids)  Slowly increase the amount of protein shake as tolerated  Sip fluids slowly and throughout the day  May use sugar substitutes in small amounts (no more than 6 - 8 packets per day; i.e. Splenda)   Fluid Goal  The first goal is to drink at least 8 ounces of protein shake/drink per day (or as directed by the nutritionist); some examples of protein shakes are Premier Protein, Syntrax Nectar, Adkins Advantage, EAS Edge HP, and Unjury. See handout from pre-op Bariatric Education Class:  Slowly increase the amount of protein shake you drink as tolerated  You may find it easier to slowly sip shakes throughout the day  It is important to  get your proteins in first  Your fluid goal is to drink 64 - 100 ounces of fluid daily  It may take a few weeks to build up to this  32 oz (or more) should be clear liquids  And  32 oz (or more) should be full liquids (see below for examples)  Liquids should not contain sugar, caffeine, or carbonation   Clear Liquids:  Water or Sugar-free flavored water (i.e. Fruit H2O, Propel)  Decaffeinated coffee or tea (sugar-free)  Crystal Lite, Wyler?s Lite, Minute Maid Lite  Sugar-free Jell-O  Bouillon or broth  Sugar-free Popsicle: *Less than 20 calories each; Limit 1 per day   Full Liquids:  Protein Shakes/Drinks + 2 choices per day of other full liquids  Full liquids must be:  No More Than 12 grams of Carbs per serving  No More Than 3 grams of Fat per serving  Strained low-fat cream soup  Non-Fat milk  Fat-free Lactaid Milk  Sugar-free yogurt (Dannon Lite & Fit, Greek yogurt, Oikos Zero)   Mackinley Cassaday, MS, RD, LDN [place] Inpatient Clinical Dietitian Pager: [phone] After Hours Pager: [phone]   

## 2017-03-06 NOTE — Discharge Summary (Signed)
Physician Discharge Summary  David House ZOX:[id number] DOB: 14-May-1990 DOA: 03/05/2017  PCP: Helane Rima, MD  Admit date: 03/05/2017 Discharge date: 03/06/2017  Recommendations for Outpatient Follow-up:   Follow-up Information    Greer Pickerel, MD. Go on 03/21/2017.   Specialty:  General Surgery Why:  at [place] information: [address] [place] [address] [phone]        Greer Pickerel, MD Follow up.   Specialty:  General Surgery Contact information: [place] [address] [phone]          Discharge Diagnoses:  Principal Problem:   Morbid obesity ([place]) Active Problems:   GERD (gastroesophageal reflux disease)   Bilateral chronic knee pain   Surgical Procedure: Laparoscopic Sleeve Gastrectomy with hiatal hernia repair, upper endoscopy  Discharge Condition: Good Disposition: Home  Diet recommendation: Postoperative sleeve gastrectomy diet (liquids only)  Filed Weights   03/05/17 0843 03/06/17 0553  Weight: (!) 169.4 kg (373 lb 6.4 oz) (!) 170.1 kg (375 lb 0 oz)     Hospital Course:  The patient was admitted for a planned laparoscopic sleeve gastrectomy. Please see operative note. Preoperatively the patient was given 5000 units of subcutaneous heparin for DVT prophylaxis. Postoperative prophylactic Lovenox dosing was started on the evening of postoperative day 0. ERAS protocol was used. On the evening of postoperative day 0, the patient was started on water and ice chips. On postoperative day 1 the patient had no fever or tachycardia and was tolerating water in their diet was gradually advanced throughout the day. The patient was ambulating without difficulty. Their vital signs are stable without fever or tachycardia. Their hemoglobin had remained stable. He had some mild gas pain and some discomfort around his extraction site. The patient had received discharge instructions and counseling. They were deemed stable  for discharge and had met discharge criteria  BP (!) 116/59 (BP Location: Right Arm)   Pulse 76   Temp 98 F (36.7 C) (Oral)   Resp 18   Ht 5' 9.5" (1.765 m)   Wt (!) 170.1 kg (375 lb 0 oz)   SpO2 97%   BMI 54.58 kg/m   Gen: alert, NAD, non-toxic appearing Pupils: equal, no scleral icterus Pulm: Lungs clear to auscultation, symmetric chest rise CV: regular rate and rhythm Abd: soft, mild approp tender, nondistended. No cellulitis. No incisional hernia Ext: no edema, no calf tenderness Skin: no rash, no jaundice  Discharge Instructions  Discharge Instructions    Ambulate hourly while awake   Complete by:  As directed    Call MD for:  difficulty breathing, headache or visual disturbances   Complete by:  As directed    Call MD for:  persistant dizziness or light-headedness   Complete by:  As directed    Call MD for:  persistant nausea and vomiting   Complete by:  As directed    Call MD for:  redness, tenderness, or signs of infection (pain, swelling, redness, odor or green/yellow discharge around incision site)   Complete by:  As directed    Call MD for:  severe uncontrolled pain   Complete by:  As directed    Call MD for:  temperature >101 F   Complete by:  As directed    Diet bariatric full liquid   Complete by:  As directed    Discharge instructions   Complete by:  As directed    See bariatric discharge instructions   Incentive spirometry  Complete by:  As directed    Perform hourly while awake     Allergies as of 03/06/2017   No Known Allergies     Medication List    STOP taking these medications   famotidine 20 MG tablet Commonly known as:  PEPCID     TAKE these medications   omeprazole 40 MG capsule Commonly known as:  PRILOSEC Take 40 mg by mouth daily.   oxyCODONE 5 MG/5ML solution Commonly known as:  ROXICODONE Take 5-10 mLs (5-10 mg total) by mouth every 4 (four) hours as needed for moderate pain or severe pain.      Follow-up Information     Greer Pickerel, MD. Go on 03/21/2017.   Specialty:  General Surgery Why:  at [place] information: [address] [place] [address] [phone]        Greer Pickerel, MD Follow up.   Specialty:  General Surgery Contact information: [place] [address] [phone]            The results of significant diagnostics from this hospitalization (including imaging, microbiology, ancillary and laboratory) are listed below for reference.    Significant Diagnostic Studies: No results found.  Labs: Basic Metabolic Panel: Recent Labs  Lab 03/02/17 1207 03/06/17 0507  NA 138 138  K 3.9 3.8  CL 106 107  CO2 25 26  GLUCOSE 112* 120*  BUN 14 9  CREATININE 0.98 0.92  CALCIUM 9.4 8.7*   Liver Function Tests: Recent Labs  Lab 03/02/17 1207 03/06/17 0507  AST 38 76*  ALT 29 61  ALKPHOS 81 69  BILITOT 0.6 0.6  PROT 8.4* 7.5  ALBUMIN 4.3 3.8    CBC: Recent Labs  Lab 03/02/17 1207 03/05/17 1431 03/06/17 0507  WBC 6.8  --  11.1*  NEUTROABS 3.6  --  8.6*  HGB 12.6* 12.6* 11.8*  HCT 39.9 39.4 37.9*  MCV 83.8  --  84.4  PLT 282  --  294    CBG: Recent Labs  Lab 03/05/17 1349  GLUCAP 131*    Principal Problem:   Morbid obesity ([place]) Active Problems:   GERD (gastroesophageal reflux disease)   Bilateral chronic knee pain   Time coordinating discharge: 15 min  Signed:  Gayland Curry, MD [facility], [place] [phone] 03/06/2017, 10:56 AM

## 2017-03-06 NOTE — Progress Notes (Signed)
Patient alert and oriented, Post op day 1.  Provided support and encouragement.  Encouraged pulmonary toilet, ambulation and small sips of liquids. Patient completed 12 ounces of clear fluids overnight transitioned to protein shake.  Tolerated 4 ounces this am.  All questions answered.  Will continue to monitor.

## 2017-03-06 NOTE — Progress Notes (Signed)
Patient alert and oriented, pain is controlled. Patient is tolerating fluids, advanced to protein shake today, patient is tolerating well.  Reviewed Gastric sleeve discharge instructions with patient and patient is able to articulate understanding.  Provided information on BELT program, Support Group and [facility] outpatient pharmacy. All questions answered, will continue to monitor.  

## 2017-03-07 ENCOUNTER — Other Ambulatory Visit: Payer: Self-pay | Admitting: *Deleted

## 2017-03-07 ENCOUNTER — Encounter: Payer: Self-pay | Admitting: *Deleted

## 2017-03-07 NOTE — Patient Outreach (Signed)
[facility]) Care Management  03/07/2017  David House 1990-08-21 [id number]   Subjective: Telephone call to patient's home / mobile number, spoke with patient, and HIPAA verified.  Discussed [facility] Care Management UMR Transition of care follow up, patient voiced understanding, and is in agreement to follow up.  Patient states he remembers speaking with this RNCM in the past, his surgery went very well, tolerating liquid diet, having some gas pains, discussed gas pain relief strategies, voices understanding, and he is utilizing gas x medications as needed per MD.  States he has a follow up appointment with surgeon on 03/21/17.  Patient states he is able to manage self care and has assistance as needed with activities of daily living / home management.  Patient voices understanding of medical diagnosis, surgery, and treatment plan.  [facility] benefits discussed on 02/27/17  preoperative call and patient states no additional questions at this time.  Patient states he does not have any education material, transition of care, care coordination, disease management, disease monitoring, transportation, community resource, or pharmacy needs at this time.   States he is very appreciative of the follow up and is in agreement to receive [facility] Care Management information.     Objective: Per KPN (Knowledge Performance Now, point of care tool) and chart review, patient hospitalized 03/05/17 -03/06/17 for morbid obesity.  Status post LAPAROSCOPIC GASTRIC SLEEVE RESECTION WITH HIATAL HERNIA REPAIR and Upper endoscopy on 03/05/17 at [facility].   Patient also has a history of chronic bilateral knee pain.       Assessment: Received UMR  Preoperative / Transition of care referral on 02/21/17.   Preoperative call completed, and Transition of care follow up completed, no care management needs, and will proceed with case closure.      Plan: RNCM will send patient successful outreach letter, [facility]  pamphlet, and magnet. RNCM will send case closure due to follow up completed / no care management needs request to Iverson Alamin at [facility] Care Management.      David House, BSN, CCM [facility] Care Management [facility] Telephonic CM Phone: [phone] Fax: [phone]

## 2017-03-12 ENCOUNTER — Telehealth (HOSPITAL_COMMUNITY): Payer: Self-pay

## 2017-03-12 NOTE — Telephone Encounter (Signed)
Patient called to discuss post bariatric surgery follow up questions.  See below:   1.  Tell me about your pain and pain management?no pain feels great hasn't needed anything  2.  Let's talk about fluid intake.  How much total fluid are you taking in?64+ ounces of fluid per day  3.  How much protein have you taken in the last 2 days?80+ grams of protein, patient was asking about scrambled egg.  Encouraged to stay on liquid diet as this is his chance to heal before advancing.  4.  Have you had nausea?  Tell me about when have experienced nausea and what you did to help?no nausea and no reflux since surgery  5.  Has the frequency or color changed with your urine?urine light in color and goes to bathroom alot  6.  Tell me what your incisions look like?look good no problems  7.  Have you been passing gas? BM?BM every day since Thursday after discharge  8.  If a problem or question were to arise who would you call?  Do you know contact numbers for [facility], [facility], and [facility]?is aware how to contact all services  9.  How has the walking going?walking around a lot  10.  How are your vitamins and calcium going?  How are you taking them?No problems with vitamins or calcium

## 2017-03-19 ENCOUNTER — Encounter: Payer: 59 | Attending: General Surgery | Admitting: Registered"

## 2017-03-19 ENCOUNTER — Encounter: Payer: Self-pay | Admitting: Registered"

## 2017-03-19 DIAGNOSIS — Z713 Dietary counseling and surveillance: Secondary | ICD-10-CM | POA: Insufficient documentation

## 2017-03-19 DIAGNOSIS — E669 Obesity, unspecified: Secondary | ICD-10-CM

## 2017-03-19 NOTE — Progress Notes (Signed)
Bariatric Class:  Appt start time: 11:45 end time:  12:10  2 Week Post-Operative Nutrition Class  Patient was seen on 03/19/2017 for Post-Operative [facility]   Surgery date: 03/05/2017 Surgery type: Sleeve gastrectomy Start weight at [facility]: 385.8 Weight today: Pt declined states he weighed at home this morning Weight change: N/A  Pt states he is drinking more 64 ounces of fluid a day more than 80 grams of protein.Pt states he is not having any complications and everything is going well. Pt states he enjoys salmon, eggs, and cheese at [employer] for Sunday brunch.    TANITA  BODY COMP RESULTS  03/19/2017   BMI (kg/m^2) Pt declined   Fat Mass (lbs)    Fat Free Mass (lbs)    Total Body Water (lbs)    The following the learning objectives were met by the patient during this course:  Identifies Phase 3A (Soft, High Proteins) Dietary Goals and will begin from 2 weeks post-operatively to 2 months post-operatively  Identifies appropriate sources of fluids and proteins   States protein recommendations and appropriate sources post-operatively  Identifies the need for appropriate texture modifications, mastication, and bite sizes when consuming solids  Identifies appropriate multivitamin and calcium sources post-operatively  Describes the need for physical activity post-operatively and will follow MD recommendations  States when to call healthcare provider regarding medication questions or post-operative complications  Handouts given during class include:  Phase 3A: Soft, High Protein Diet Handout  Follow-Up Plan: Patient will follow-up at [facility] in 6 weeks for 2 month post-op nutrition visit for diet advancement per MD.

## 2017-03-20 ENCOUNTER — Ambulatory Visit: Payer: Self-pay

## 2017-04-25 ENCOUNTER — Encounter: Payer: 59 | Attending: General Surgery | Admitting: Registered"

## 2017-04-25 ENCOUNTER — Encounter: Payer: Self-pay | Admitting: Registered"

## 2017-04-25 DIAGNOSIS — Z713 Dietary counseling and surveillance: Secondary | ICD-10-CM | POA: Insufficient documentation

## 2017-04-25 DIAGNOSIS — E669 Obesity, unspecified: Secondary | ICD-10-CM

## 2017-04-25 NOTE — Patient Instructions (Addendum)
Goals:  Follow Phase 3B: High Protein + Non-Starchy Vegetables  Eat 3-6 small meals/snacks, every 3-5 hrs  Increase lean protein foods to meet 80g goal  Increase fluid intake to 64oz +  Avoid drinking 15 minutes before, during and 30 minutes after eating  Aim for >30 min of physical activity daily  - Try [employer] multivitamin.

## 2017-04-25 NOTE — Progress Notes (Signed)
Follow-up visit: 8 Weeks Post-Operative Sleeve gastrectomy Surgery  Medical Nutrition Therapy:  Appt start time: 10:10 end time:  11:00.  Primary concerns today: Post-operative Bariatric Surgery Nutrition Management.  Non scale victories: less winded, clothes fitting looser, more energy, happy, more room in booth  Surgery date: 03/05/2017 Surgery type: Sleeve gastrectomy Start weight at [facility]: 385.8 Weight today: Pt declined states he weighed at home this morning, 331 lbs pt report Weight change: N/A Total weight lost: N/A Weight loss goal: <300 lbs; ultimately ~250-260 lbs   TANITA  BODY COMP RESULTS  03/19/2017 04/25/2017   BMI (kg/m^2) Pt declined Pt declined   Fat Mass (lbs)     Fat Free Mass (lbs)     Total Body Water (lbs)     Pt states he is doing well and has not had any complications. Pt states he has lost 41 lbs since surgery date. Pt states he is learning how to have a good relationship with food and food is no longer a priority, life is. Pt states he is very well pleased with everything. 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   Preferred Learning Style:   No preference indicated   Learning Readiness:   Ready  Change in progress  24-hr recall: B (AM): protein powder (23g) Snk (AM): 1 egg (6g), cheese (6g)   L (PM): 1/2 Administrator (PM): 1/2 Teaching laboratory technician  D (PM): air-fried 4 chicken wings (21 oz) Snk (PM): none  Fluid intake: water, Gatorade zero, hot tea; 64+ oz Estimated total protein intake: ~80 grams  Medications: See list Supplementation: Bariatric Advantage +3 calcium supplements  Using straws: N/A Drinking while eating: no Having you been chewing well: no Chewing/swallowing difficulties: no Changes in vision: no Changes to mood/headaches: no Hair loss/Cahnges to skin/Changes to nails: no, no, no Any difficulty focusing or concentrating: no Sweating:  no Dizziness/Lightheaded:  Palpitations: no  Carbonated beverages: no N/V/D/C/GAS: no, no, no, no, no Abdominal Pain: no Dumping syndrome: no Last Lap-Band fill: N/A  Recent physical activity: Cardio  Strength-training 2x/week  Progress Towards Goal(s):  In progress.  Handouts given during visit include:  Phase IV: High Protein + NS vegetables   Nutritional Diagnosis:  [place]-3.3 Overweight/obesity related to past poor dietary habits and physical inactivity as evidenced by patient w/ recent sleeve gastrectomy surgery following dietary guidelines for continued weight loss.     Intervention:  Nutrition education and counseling.  Goals:  Follow Phase 3B: High Protein + Non-Starchy Vegetables  Eat 3-6 small meals/snacks, every 3-5 hrs  Increase lean protein foods to meet 80g goal  Increase fluid intake to 64oz +  Avoid drinking 15 minutes before, during and 30 minutes after eating  Aim for >30 min of physical activity daily - Try [employer] multivitamin.   Teaching Method Utilized:  Visual Auditory Hands on  Barriers to learning/adherence to lifestyle change: none identified  Demonstrated degree of understanding via:  Teach Back   Monitoring/Evaluation:  Dietary intake, exercise, lap band fills, and body weight. Follow up in 4 months for 6 month post-op visit.

## 2017-07-25 DIAGNOSIS — K529 Noninfective gastroenteritis and colitis, unspecified: Secondary | ICD-10-CM | POA: Diagnosis not present

## 2017-07-25 MED FILL — ONDANSETRON ODT 4 MG TABLET: 4 | 6 days supply | Qty: 20 | Fill #0

## 2017-08-27 ENCOUNTER — Encounter: Payer: Self-pay | Admitting: Registered"

## 2017-08-27 ENCOUNTER — Encounter: Payer: 59 | Attending: General Surgery | Admitting: Registered"

## 2017-08-27 DIAGNOSIS — Z836 Family history of other diseases of the respiratory system: Secondary | ICD-10-CM | POA: Diagnosis not present

## 2017-08-27 DIAGNOSIS — Z800 Family history of malignant neoplasm of digestive organs: Secondary | ICD-10-CM | POA: Insufficient documentation

## 2017-08-27 DIAGNOSIS — Z833 Family history of diabetes mellitus: Secondary | ICD-10-CM | POA: Insufficient documentation

## 2017-08-27 DIAGNOSIS — Z9889 Other specified postprocedural states: Secondary | ICD-10-CM | POA: Insufficient documentation

## 2017-08-27 DIAGNOSIS — Z8371 Family history of colonic polyps: Secondary | ICD-10-CM | POA: Insufficient documentation

## 2017-08-27 DIAGNOSIS — Z713 Dietary counseling and surveillance: Secondary | ICD-10-CM | POA: Diagnosis not present

## 2017-08-27 DIAGNOSIS — Z79899 Other long term (current) drug therapy: Secondary | ICD-10-CM | POA: Insufficient documentation

## 2017-08-27 DIAGNOSIS — K219 Gastro-esophageal reflux disease without esophagitis: Secondary | ICD-10-CM | POA: Insufficient documentation

## 2017-08-27 DIAGNOSIS — E7800 Pure hypercholesterolemia, unspecified: Secondary | ICD-10-CM | POA: Insufficient documentation

## 2017-08-27 DIAGNOSIS — Z6841 Body Mass Index (BMI) 40.0 and over, adult: Secondary | ICD-10-CM | POA: Insufficient documentation

## 2017-08-27 DIAGNOSIS — Z8261 Family history of arthritis: Secondary | ICD-10-CM | POA: Insufficient documentation

## 2017-08-27 DIAGNOSIS — Z8041 Family history of malignant neoplasm of ovary: Secondary | ICD-10-CM | POA: Insufficient documentation

## 2017-08-27 DIAGNOSIS — Z8249 Family history of ischemic heart disease and other diseases of the circulatory system: Secondary | ICD-10-CM | POA: Diagnosis not present

## 2017-08-27 DIAGNOSIS — E669 Obesity, unspecified: Secondary | ICD-10-CM

## 2017-08-27 NOTE — Patient Instructions (Addendum)
-   Order Bariatric Advantage Ultra Solo with Iron.   - Aim to eat what you can in 20-30 minutes and put the rest away.   - Take multivitamin and calcium supplements at least 2 hours away from each other.

## 2017-08-27 NOTE — Progress Notes (Signed)
Follow-up visit: 6 Months Post-Operative Sleeve gastrectomy Surgery  Medical Nutrition Therapy:  Appt start time: 11:10 end time: 11:43  Primary concerns today: Post-operative Bariatric Surgery Nutrition Management.  Non scale victories: less winded, clothes fitting looser, more energy, happy, more room in booth, walk better, feels better, shop in regular size clothes  Surgery date: 03/05/2017 Surgery type: Sleeve gastrectomy Start weight at [facility]: 385.8 Weight today:  289.3 lbs Weight change: 41.7 lbs from last report of 331 lbs (04/25/2017) Total weight lost: 96.5 lbs Weight loss goal: <300 lbs; ultimately ~250-260 lbs   TANITA  BODY COMP RESULTS  03/19/2017 04/25/2017 08/27/2017   BMI (kg/m^2) Pt declined Pt declined 42.2   Fat Mass (lbs)   119.0   Fat Free Mass (lbs)   171.0   Total Body Water (lbs)   133.4   Pt states he has been doing good and vacation was bad. Pt states he tried sweet tea and its nasty now. 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 Pt states he has been eating them sometimes. Pt states he has been eating more fried food lately due to recently moving into new home and still unpacking. Pt states she has some acid reflux a little when he overeats.   Pt states he does not see his personal trainer anymore because of relocation; plans to get back into working out. Pt states he wants to lose another 35 lbs.   Preferred Learning Style:   No preference indicated   Learning Readiness:   Ready  Change in progress  24-hr recall: B (AM): peanut butter crackers (6g) or protein powder (23g) Snk (AM): 1 egg (6g), cheese (6g)   L (PM): 1/2 [employer], spinach, broccoli, asparagus, collards Snk (PM): 1/2 [employer]  D (PM): air-fried 4 chicken wings (21 oz)  Snk (PM): none  Fluid intake: water, Gatorade zero, unsweet tea, coffee, Propel water; 64+ oz Estimated total protein intake: ~80 grams  Medications:  See list Supplementation: Bariatric Advantage +3 calcium supplements  Using straws: no Drinking while eating: no Having you been chewing well: no Chewing/swallowing difficulties: no Changes in vision: no Changes to mood/headaches: no Hair loss/Changes to skin/Changes to nails: no, no, no Any difficulty focusing or concentrating: no Sweating: no Dizziness/Lightheaded: no Palpitations: no  Carbonated beverages: no N/V/D/C/GAS: no, no, no, no, no Abdominal Pain: no Dumping syndrome: no Last Lap-Band fill: N/A  Recent physical activity: walking 20-30 min, 7 days/week  Progress Towards Goal(s):  In progress.  Handouts given during visit include:  Phase IV: High Protein + All vegetables   Nutritional Diagnosis:  [place]-3.3 Overweight/obesity related to past poor dietary habits and physical inactivity as evidenced by patient w/ recent sleeve gastrectomy surgery following dietary guidelines for continued weight loss.     Intervention:  Nutrition education and counseling. Pt was educated and counseled on the next phase of bariatric post-op diet. Pt was in agreement with goals listed.  Goals:  Follow Phase V: High Protein + All Vegetables  Eat 3-6 small meals/snacks, every 3-5 hrs  Increase lean protein foods to meet 80g goal  Increase fluid intake to 64oz +  Avoid drinking 15 minutes before, during and 30 minutes after eating  Aim for >30 min of physical activity daily - Order Bariatric Advantage Ultra Solo with Iron.  - Aim to eat what you can in 20-30 minutes and put the rest away.  - Take multivitamin and calcium supplements at least 2  hours away from each other.   Teaching Method Utilized:  Visual Auditory Hands on  Barriers to learning/adherence to lifestyle change: none identified  Demonstrated degree of understanding via:  Teach Back   Monitoring/Evaluation:  Dietary intake, exercise, lap band fills, and body weight. Follow up in 3 months for 9 month post-op  visit.

## 2017-08-30 DIAGNOSIS — R200 Anesthesia of skin: Secondary | ICD-10-CM | POA: Diagnosis not present

## 2017-08-30 DIAGNOSIS — R634 Abnormal weight loss: Secondary | ICD-10-CM | POA: Diagnosis not present

## 2017-08-30 DIAGNOSIS — R7303 Prediabetes: Secondary | ICD-10-CM | POA: Diagnosis not present

## 2017-08-31 MED FILL — VIT D2 1.25 MG (50,000 UNIT: 1.25 MG | 84 days supply | Qty: 12 | Fill #0

## 2017-09-04 DIAGNOSIS — R202 Paresthesia of skin: Secondary | ICD-10-CM | POA: Diagnosis not present

## 2017-09-04 DIAGNOSIS — M79609 Pain in unspecified limb: Secondary | ICD-10-CM | POA: Diagnosis not present

## 2017-09-04 DIAGNOSIS — R4701 Aphasia: Secondary | ICD-10-CM | POA: Diagnosis not present

## 2017-09-05 ENCOUNTER — Other Ambulatory Visit: Payer: Self-pay | Admitting: Neurology

## 2017-09-05 DIAGNOSIS — R4701 Aphasia: Secondary | ICD-10-CM

## 2017-09-11 ENCOUNTER — Ambulatory Visit
Admission: RE | Admit: 2017-09-11 | Discharge: 2017-09-11 | Disposition: A | Discharge status: Home / Self Care | Payer: 59 | Source: Ambulatory Visit | Attending: Neurology | Admitting: Neurology

## 2017-09-11 ENCOUNTER — Encounter (INDEPENDENT_AMBULATORY_CARE_PROVIDER_SITE_OTHER): Payer: Self-pay

## 2017-09-11 DIAGNOSIS — R4701 Aphasia: Secondary | ICD-10-CM | POA: Diagnosis not present

## 2017-09-11 MED ORDER — GADOBENATE DIMEGLUMINE 529 MG/ML IV SOLN
20.0000 mL | Freq: Once | INTRAVENOUS | Status: AC | PRN
  Administered 2017-09-11: 20 mL via INTRAVENOUS

## 2017-10-09 ENCOUNTER — Other Ambulatory Visit: Payer: Self-pay | Admitting: Neurology

## 2017-10-09 DIAGNOSIS — R202 Paresthesia of skin: Principal | ICD-10-CM

## 2017-10-09 DIAGNOSIS — R200 Anesthesia of skin: Secondary | ICD-10-CM

## 2017-10-25 ENCOUNTER — Ambulatory Visit
Admission: RE | Admit: 2017-10-25 | Discharge: 2017-10-25 | Disposition: A | Discharge status: Home / Self Care | Payer: 59 | Source: Ambulatory Visit | Attending: Neurology | Admitting: Neurology

## 2017-10-25 ENCOUNTER — Other Ambulatory Visit (HOSPITAL_COMMUNITY)
Admission: RE | Admit: 2017-10-25 | Discharge: 2017-10-25 | Disposition: A | Discharge status: Home / Self Care | Payer: 59 | Source: Ambulatory Visit | Attending: Neurology | Admitting: Neurology

## 2017-10-25 VITALS — BP 182/87 | HR 72

## 2017-10-25 DIAGNOSIS — R200 Anesthesia of skin: Secondary | ICD-10-CM | POA: Diagnosis not present

## 2017-10-25 DIAGNOSIS — E669 Obesity, unspecified: Secondary | ICD-10-CM | POA: Diagnosis not present

## 2017-10-25 DIAGNOSIS — R202 Paresthesia of skin: Principal | ICD-10-CM

## 2017-10-25 NOTE — Discharge Instructions (Signed)

## 2017-10-25 NOTE — Progress Notes (Signed)
One SST tube of blood drawn for LP labs from right [facility] space; site unremarkable.  Donell Sievert, RN

## 2017-10-27 LAB — TIQ- AMBIGUOUS ORDER

## 2017-11-09 DIAGNOSIS — G35 Multiple sclerosis: Secondary | ICD-10-CM | POA: Diagnosis not present

## 2017-11-23 LAB — MENINGOENCEPHALITIS COMPREHENSIVE PANEL, CSF
ADENOVIRUS ANTIBODY, CSF: <1:1 {titer}
CALIFORNIA IGG: <1:4 {titer}
CALIFORNIA IGM: <1:4 {titer}
COXSACKIE A16 AB: <1:1 {titer}
COXSACKIE A2 AB: <1:1 {titer}
COXSACKIE A7 AB: <1:1 {titer}
COXSACKIE A9 AB: <1:1 {titer}
COXSACKIE B1 AB: <1:1 {titer}
COXSACKIE B4 AB: <1:1 {titer}
COXSACKIE B5 AB: <1:1 {titer}
COXSACKIE B6 AB: <1:1 {titer}
EASTERN EQUINE IGG: <1:4 {titer}
EASTERN EQUINE IGM: <1:4 {titer}
Echovirus Ab Type 7: <1:1 {titer}
Echovirus Ab, type 4: <1:1 {titer}
HSV 1 IGM SCREEN: NEGATIVE
HSV 1 IgG Index:: 0.07
HSV 2 IGM SCREEN: NEGATIVE
HSV 2 IgG Index:: <0.01
INFLUENZA A AB: <1:1 {titer}
INFLUENZA B AB: <1:1 {titer}
INTERPRETATION: NOT DETECTED
MEASLES (RUBEOLA) IGG,IFA: <1:64 {titer}
VARICELLA ZOSTER VIRUS ANTIBODY, CSF: <1:1 {titer}

## 2017-11-23 LAB — LEUKEMIA/LYMPHOMA EVALUATION PANEL
NUMBER OF MARKERS:: 22
VIABILITY:: 53 %

## 2017-11-23 LAB — MULTIPLE SCLEROSIS PANEL 2
ALBUMIN SERUM: 4.1 g/dL (ref 3.5–5.2)
Albumin, CSF: 25.3 mg/dL (ref 8.0–42.0)
CNS-IgG Synthesis Rate: 21 mg/24 h — ABNORMAL HIGH (ref <=3.3)
IgG (Immunoglobin G), Serum: 1490 mg/dL (ref 600–1640)
IgG Total CSF: 9.4 mg/dL — ABNORMAL HIGH (ref 0.8–7.7)
IgG-Index: 1.02 — ABNORMAL HIGH (ref <0.66)

## 2017-11-23 LAB — CMV (CYTOMEGALOVIRUS) DNA ULTRAQUANT, PCR
CMV DNA, QN PCR: <2.3 log IU/mL (ref <2.30)
CMV DNA, QN Real Time PCR: <200 IU/mL (ref <200)

## 2017-11-23 LAB — FUNGUS CULTURE W SMEAR
MICRO NUMBER:: 91094011
SMEAR: NONE SEEN
SPECIMEN QUALITY: ADEQUATE

## 2017-11-23 LAB — CSF CELL COUNT WITH DIFFERENTIAL
Basophils, %: 0 %
EOS CSF: 0 %
Lymphs, CSF: 95 % — ABNORMAL HIGH (ref 40–80)
Monocyte/Macrophage: 5 % — ABNORMAL LOW (ref 15–45)
RBC COUNT CSF: 4 {cells}/uL (ref 0–10)
SEGMENTED NEUTROPHILS-CSF: 0 % (ref 0–6)
WBC CSF: 10 {cells}/uL — AB (ref 0–5)

## 2017-11-23 LAB — VDRL, CSF: SYPHILIS VDRL QUANT CSF: NONREACTIVE

## 2017-11-23 LAB — BORRELIA SPECIES DNA, FLUID, PCR: B. burgdorferi DNA: NOT DETECTED

## 2017-11-23 LAB — CSF CULTURE W GRAM STAIN

## 2017-11-23 LAB — CSF CULTURE
MICRO NUMBER: 91094012
RESULT: NO GROWTH
SPECIMEN QUALITY: ADEQUATE

## 2017-11-23 LAB — ANGIOTENSIN CONVERTING ENZYME, CSF: ACE, CSF: 13 U/L (ref <=15)

## 2017-11-23 LAB — EPSTEIN BARR VIRUS DNA, QUANT RTPCR

## 2017-11-23 LAB — VARICELLA ZOSTER VIRUS (VZV) DNA, QUANTITATIVE REAL-TIME PCR: VZV QN PCR: <500 copies/mL (ref <500)

## 2017-11-23 LAB — PROTEIN, CSF: Total Protein, CSF: 52 mg/dL — ABNORMAL HIGH (ref 15–45)

## 2017-11-23 LAB — GLUCOSE, CSF: GLUCOSE CSF: 55 mg/dL (ref 40–80)

## 2017-11-23 LAB — LYME DISEASE ABS IGG, IGM, IFA, CSF
Lyme Disease AB (IgG), IBL: NOT DETECTED
Lyme Disease AB (IgM), IBL: NOT DETECTED

## 2017-11-26 ENCOUNTER — Ambulatory Visit: Payer: Self-pay | Admitting: Registered"

## 2017-12-07 ENCOUNTER — Ambulatory Visit: Payer: Self-pay | Admitting: Neurology

## 2017-12-07 ENCOUNTER — Encounter

## 2018-06-19 ENCOUNTER — Ambulatory Visit: Payer: 59 | Admitting: Dietician

## 2018-06-19 NOTE — Patient Instructions (Addendum)
Goals:   Continue consuming a minimum of 80 grams of protein daily   Chew food thoroughly, take small bites, eat at least 3 times per day, and add snacks as needed (listen to your body)  Create well balanced meals: Protein + Non-Starchy Vegetable + Complex Carbohydrate (see "Bariatric Meal Ideas" handout)   Portion control: eat until satisfied, not full  Drink at least 64 ounces of fluid daily (with at least 32 ounces of that being plain water) and try to avoid carbonation and added sugar   Take bariatric multivitamin and calcium supplements daily   Aim for 150 minutes of physical activity per week, including cardiovascular, strength/resistance exercises, and flexibility (such as stretching or yoga)   Keep up the GREAT work! Remember to focus on your big picture goals and remind yourself of your successes since you started this journey, such as feeling better, having more energy, etc. We will see you at your next follow up appointment on Tuesday, September 17, 2018 at 10:00am. Please contact us in the meantime with any questions or concerns.

## 2018-06-19 NOTE — Progress Notes (Signed)
Bariatric Follow-Up Visit Medical Nutrition Therapy  Appt Start Time: 11:30am End Time: 12:00pm  Telephone Visit This visit was completed via telephone due to the COVID-19 pandemic.   I spoke with David House and verified that I was speaking with the correct person with two patient identifiers (full name and date of birth).   I discussed the limitations related to this kind of visit and the patient is willing to proceed.   1 Year + 4 Months Post-Operative Sleeve Gastrectomy Surgery Surgery Date: 03/05/2017   NUTRITION ASSESSMENT  Anthropometrics  Start weight at [facility]: 385.8 lbs (11/27/2016) Today's weight: 294 lbs (pt reported)  Total weight lost: 91.8 lbs  Pt reported his weight is typically 294-295 lbs. Pt states this is his lowest weight since surgery. Pt states his weight has increased to about 300 lbs but he has been able to come back down to 294 lbs. Pt's original weight goal was to reach ~250 lbs, however he states he feels great where he is at and is satisfied with his progress.   TANITA  BODY COMP RESULTS  03/19/2017 04/25/2017 08/27/2017 06/19/2018   BMI (kg/m^2) Pt declined Pt declined 42.2 N/A   Fat Mass (lbs)   119.0    Fat Free Mass (lbs)   171.0    Total Body Water (lbs)   133.4    24-Hr Dietary Recall First Meal: protein shake Snack: grapes (or nuts, or sunflower seeds)   Second Meal: roasted [place] Snack: cheese   Third Meal: salmon + broccoli or asparagus  Snack: none stated  Beverages: water, Propel water, unsweetened tea with Splenda   Food & Nutrition Related Hx Dietary Hx: Pt states that since his last visit with us at [facility] (about 10 months ago) he "fell off" his food plan. Previously, foods eaten included fried foods, carbonated beverages, and sweets. Currently, typical foods include chicken, [place]key, lean meats, air fried foods, peanut butter, and fruits, vegetables, seeds, and cheese for snacks. Pt states he and his wife gave up beef and pork  during lent and have not reintroduced these foods. No fried foods eating in 2 weeks, as fried foods "sit heavy." Pt states he recognizes fullness and hunger, and how it is now different than before surgery and even right after surgery. Pt is concerned that he eats "too much" at a time, for example being able to eat a full 4 ounce chicken breast as his meal. Currently sips fluids with meals, states this does not cause issues or prevent from meeting food/ fluid goals.  Supplements: calcium daily  Estimated Daily Fluid Intake: 64+ oz Estimated Daily Protein Intake: 80+ g  Physical Activity  Current average weekly physical activity: walking (used to go to the gym)    Post-Op Goals Using straws: sometimes  Drinking while eating: yes  Chewing/swallowing difficulties: no Changes in vision: no Changes to mood/headaches: no Hair loss/changes to skin/nails: no Difficulty focusing/concentrating: no Sweating: no Dizziness/lightheadedness: no Palpitations: no  Carbonated/caffeinated beverages: yes N/V/D/C/Gas: nausea (rt acid reflux)  Abdominal pain: no  Dumping syndrome: no Last Lap-Band fill: N/A   NUTRITION DIAGNOSIS  Overweight/obesity (Craig-3.3) related to past poor dietary habits and physical inactivity as evidenced by patient w/ completed Sleeve Gastrectomy surgery following dietary guidelines for continued weight loss.   NUTRITION INTERVENTION Nutrition counseling (C-1) and education (E-2) to facilitate bariatric surgery goals, including: . Diet continuation of Phase VII - Maintenance Phase which will be lifelong, including appropriate sources of fluids, protein, non-starchy vegetables, and complex carbohydrates  to create well-balanced meals/snacks . Portion control   Appropriate multivitamin and calcium sources post-operatively . The importance of daily physical activity and to reach a goal of at least 150 minutes of moderate to vigorous physical activity weekly (or as directed by their  physician) due to benefits such as increased musculature and improved lab values  Handouts Provided Include   Phase VII: Maintenance Phase (Lifelong)   Bariatric Vitamins & Minerals   Bariatric Meal Ideas   *After Visit Summary and handouts were provided via MyChart per patient request.   Learning Style & Readiness for Change Teaching method utilized: Visual & Auditory  Demonstrated degree of understanding via: Teach Back  Barriers to learning/adherence to lifestyle change: None Identified   RD's Notes for Next Visit . SMART goals  . Should I Eat? handout   MONITORING & EVALUATION Dietary intake, weekly physical activity, body weight, and supplement intake in 3 months.  Next Steps Patient will follow-up at [facility] for on-going post-op nutrition visits as desired by patient. Next appointment will be in 3 months for 1.5 year post-op follow-up per patient request.

## 2018-09-17 ENCOUNTER — Ambulatory Visit: Payer: 59 | Admitting: Dietician

## 2018-10-22 ENCOUNTER — Encounter (HOSPITAL_COMMUNITY): Payer: Self-pay

## 2018-11-08 DIAGNOSIS — Z23 Encounter for immunization: Secondary | ICD-10-CM | POA: Diagnosis not present

## 2018-12-24 DIAGNOSIS — R69 Illness, unspecified: Secondary | ICD-10-CM | POA: Diagnosis not present

## 2019-01-10 DIAGNOSIS — Z20828 Contact with and (suspected) exposure to other viral communicable diseases: Secondary | ICD-10-CM | POA: Diagnosis not present

## 2019-01-10 DIAGNOSIS — J069 Acute upper respiratory infection, unspecified: Secondary | ICD-10-CM | POA: Diagnosis not present

## 2019-01-10 DIAGNOSIS — R519 Headache, unspecified: Secondary | ICD-10-CM | POA: Diagnosis not present

## 2019-01-16 DIAGNOSIS — R69 Illness, unspecified: Secondary | ICD-10-CM | POA: Diagnosis not present

## 2019-01-27 DIAGNOSIS — J358 Other chronic diseases of tonsils and adenoids: Secondary | ICD-10-CM | POA: Diagnosis not present

## 2019-01-28 DIAGNOSIS — R69 Illness, unspecified: Secondary | ICD-10-CM | POA: Diagnosis not present

## 2019-02-11 DIAGNOSIS — R69 Illness, unspecified: Secondary | ICD-10-CM | POA: Diagnosis not present

## 2019-02-25 DIAGNOSIS — R69 Illness, unspecified: Secondary | ICD-10-CM | POA: Diagnosis not present

## 2019-03-11 DIAGNOSIS — R69 Illness, unspecified: Secondary | ICD-10-CM | POA: Diagnosis not present

## 2019-04-22 DIAGNOSIS — R69 Illness, unspecified: Secondary | ICD-10-CM | POA: Diagnosis not present

## 2019-04-29 DIAGNOSIS — R69 Illness, unspecified: Secondary | ICD-10-CM | POA: Diagnosis not present

## 2019-06-03 DIAGNOSIS — R69 Illness, unspecified: Secondary | ICD-10-CM | POA: Diagnosis not present

## 2019-06-12 DIAGNOSIS — R69 Illness, unspecified: Secondary | ICD-10-CM | POA: Diagnosis not present

## 2019-06-17 DIAGNOSIS — R69 Illness, unspecified: Secondary | ICD-10-CM | POA: Diagnosis not present

## 2019-07-29 DIAGNOSIS — R69 Illness, unspecified: Secondary | ICD-10-CM | POA: Diagnosis not present

## 2019-08-19 DIAGNOSIS — R69 Illness, unspecified: Secondary | ICD-10-CM | POA: Diagnosis not present

## 2019-09-05 DIAGNOSIS — R5383 Other fatigue: Secondary | ICD-10-CM | POA: Diagnosis not present

## 2019-09-08 MED FILL — VIT D2 1.25 MG (50,000 UNIT: 1.25 MG | 28 days supply | Qty: 4 | Fill #0

## 2019-09-25 ENCOUNTER — Encounter (HOSPITAL_COMMUNITY): Payer: Self-pay

## 2019-10-08 MED FILL — VIT D2 1.25 MG (50,000 UNIT: 1.25 MG | 28 days supply | Qty: 4 | Fill #1

## 2019-10-29 DIAGNOSIS — Z9884 Bariatric surgery status: Secondary | ICD-10-CM | POA: Diagnosis not present

## 2019-11-07 MED FILL — VIT D2 1.25 MG (50,000 UNIT: 1.25 MG | 28 days supply | Qty: 4 | Fill #2

## 2019-11-11 DIAGNOSIS — R69 Illness, unspecified: Secondary | ICD-10-CM | POA: Diagnosis not present

## 2019-11-18 DIAGNOSIS — M2141 Flat foot [pes planus] (acquired), right foot: Secondary | ICD-10-CM | POA: Diagnosis not present

## 2019-11-18 DIAGNOSIS — M2142 Flat foot [pes planus] (acquired), left foot: Secondary | ICD-10-CM | POA: Diagnosis not present

## 2019-11-18 DIAGNOSIS — M722 Plantar fascial fibromatosis: Secondary | ICD-10-CM | POA: Diagnosis not present

## 2019-11-27 DIAGNOSIS — Z136 Encounter for screening for cardiovascular disorders: Secondary | ICD-10-CM | POA: Diagnosis not present

## 2019-11-27 DIAGNOSIS — Z1331 Encounter for screening for depression: Secondary | ICD-10-CM | POA: Diagnosis not present

## 2019-11-27 DIAGNOSIS — Z0000 Encounter for general adult medical examination without abnormal findings: Secondary | ICD-10-CM | POA: Diagnosis not present

## 2019-11-27 DIAGNOSIS — Z9884 Bariatric surgery status: Secondary | ICD-10-CM | POA: Diagnosis not present

## 2019-11-27 DIAGNOSIS — Z130 Encounter for screening for diseases of the blood and blood-forming organs and certain disorders involving the immune mechanism: Secondary | ICD-10-CM | POA: Diagnosis not present

## 2019-11-27 DIAGNOSIS — Z131 Encounter for screening for diabetes mellitus: Secondary | ICD-10-CM | POA: Diagnosis not present

## 2019-11-27 DIAGNOSIS — Z1329 Encounter for screening for other suspected endocrine disorder: Secondary | ICD-10-CM | POA: Diagnosis not present

## 2019-11-27 DIAGNOSIS — Z13220 Encounter for screening for lipoid disorders: Secondary | ICD-10-CM | POA: Diagnosis not present

## 2019-11-27 DIAGNOSIS — Z23 Encounter for immunization: Secondary | ICD-10-CM | POA: Diagnosis not present

## 2019-12-08 DIAGNOSIS — Z23 Encounter for immunization: Secondary | ICD-10-CM | POA: Diagnosis not present

## 2020-06-15 IMAGING — MR MR HEAD WO/W CM
12 series · 48 of 48 positions shown · IV contrast (Multihance 20ml)
Comparison: None.

CLINICAL DATA: Aphasia

EXAM:
MRI HEAD WITHOUT AND WITH CONTRAST
TECHNIQUE: Multiplanar, multiecho pulse sequences of the brain and surrounding
structures were obtained without and with intravenous contrast.
CONTRAST:  20mL MULTIHANCE GADOBENATE DIMEGLUMINE 529 MG/ML IV SOLN

[Series 5: T1 · sagittal · 4.0mm · 0.75mm/px · 2 of 31 slices shown (1 of 3)]
[im 1/31]
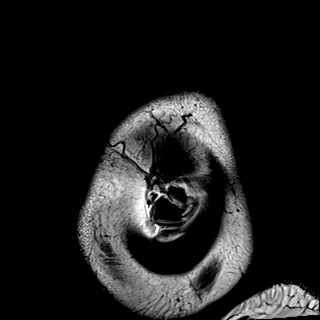
[im 31/31]
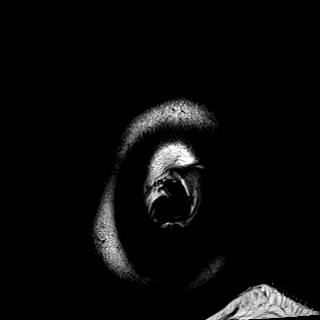

[Series 6: DWI · axial · 3.0mm · 1.50mm/px · z∈[-36,+108]mm · 5 of 92 slices shown (1 of 4)]
[im 1/92]
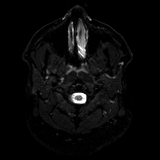
[im 23/92]
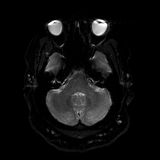
[im 46/92]
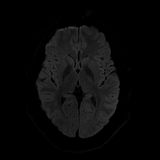
[im 69/92]
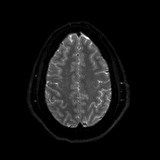
[im 92/92]
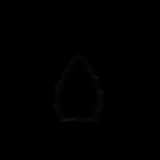

[Series 7: DWI · axial · 3.0mm · 1.50mm/px · z∈[-36,+108]mm · 3 of 46 slices shown (2 of 4)]
[im 1/46]
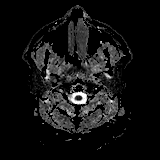
[im 23/46]
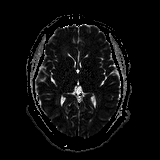
[im 46/46]
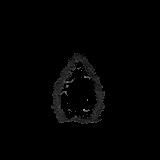

[Series 8: DWI · coronal · 5.0mm · 1.44mm/px · 4 of 68 slices shown (3 of 4)]
[im 1/68]
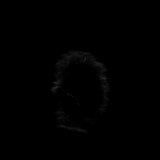
[im 23/68]
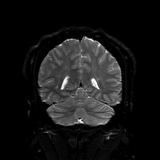
[im 45/68]
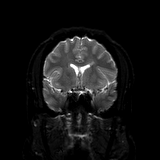
[im 68/68]
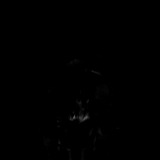

[Series 9: DWI · coronal · 5.0mm · 1.44mm/px · 2 of 34 slices shown (4 of 4)]
[im 1/34]
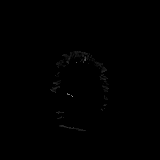
[im 34/34]
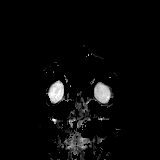

[Series 10: T2 · axial · 4.0mm · 0.38mm/px · z∈[-40,+110]mm · 2 of 31 slices shown]
[im 1/31]
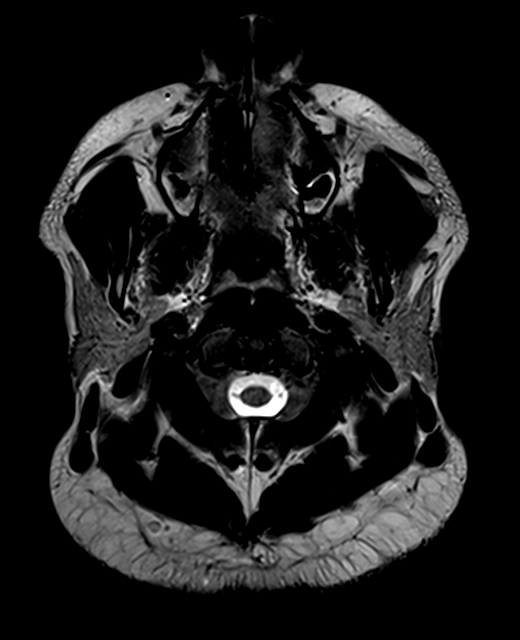
[im 31/31]
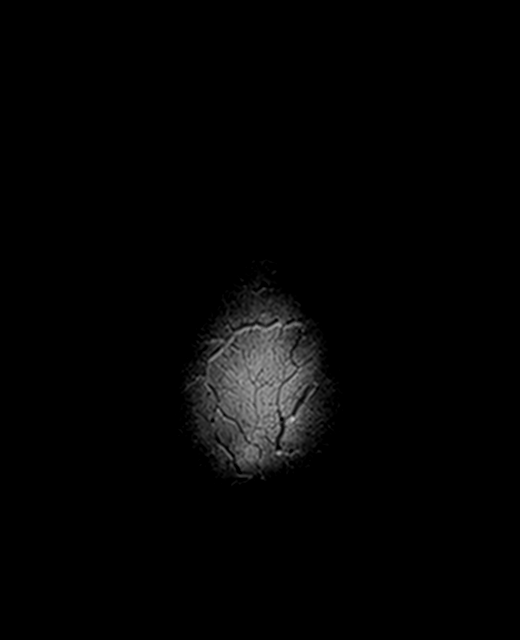

[Series 11: FLAIR · axial · 3.0mm · 0.75mm/px · z∈[-45,+112]mm · 2 of 28 slices shown]
[im 1/28]
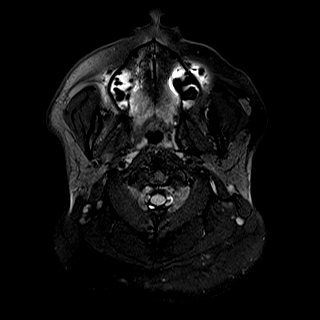
[im 28/28]
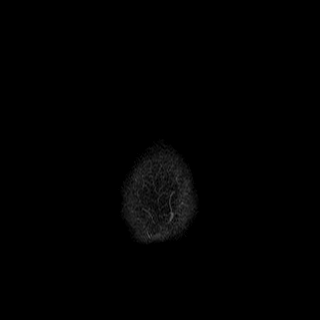

[Series 13: swi_images · axial · 1.5mm · 0.90mm/px · z∈[-35,+103]mm · 6 of 96 slices shown]
[im 1/96]
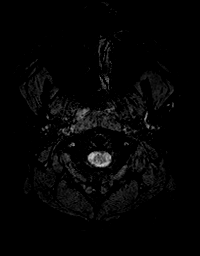
[im 20/96]
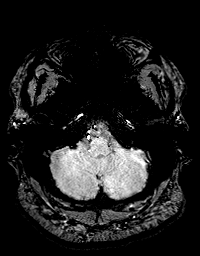
[im 39/96]
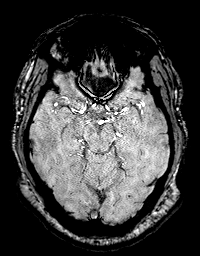
[im 58/96]
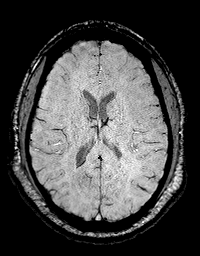
[im 77/96]
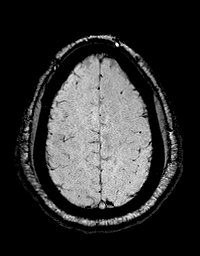
[im 96/96]
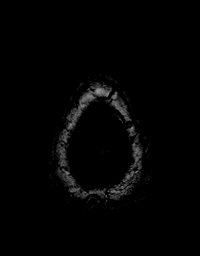

[Series 14: T1 · axial · 1.0mm · 0.94mm/px · z∈[-41,+112]mm · 9 of 160 slices shown (2 of 3)]
[im 1/160]
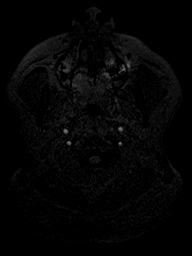
[im 20/160]
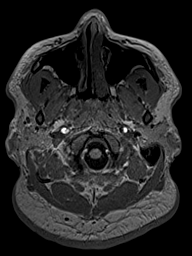
[im 40/160]
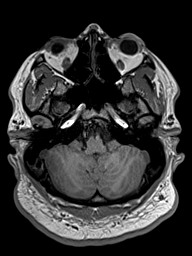
[im 60/160]
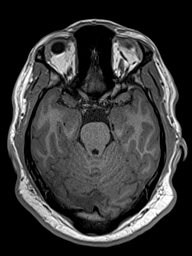
[im 80/160]
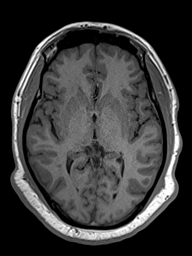
[im 100/160]
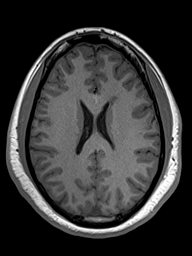
[im 120/160]
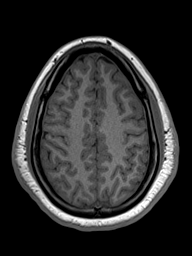
[im 140/160]
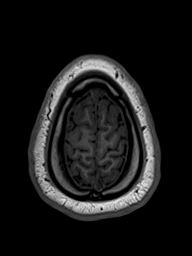
[im 160/160]
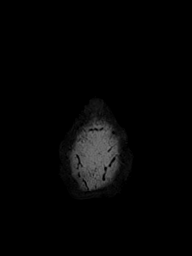

[Series 19: T2 post-contrast · coronal · 4.5mm · 0.36mm/px · 2 of 37 slices shown]
[im 1/37]
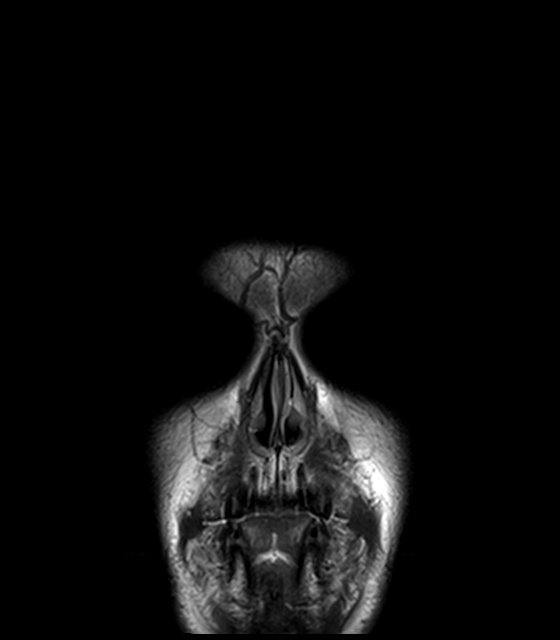
[im 37/37]
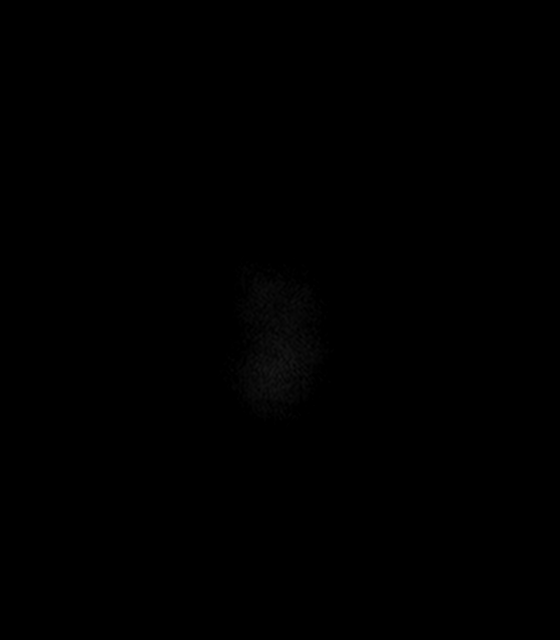

[Series 20: T1 · axial · 1.0mm · 0.94mm/px · z∈[-38,+116]mm · 9 of 160 slices shown (3 of 3)]
[im 1/160]
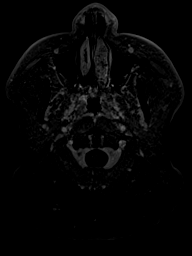
[im 20/160]
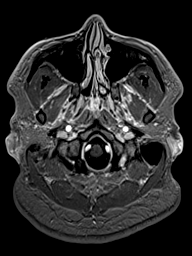
[im 40/160]
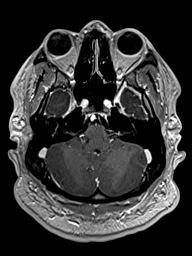
[im 60/160]
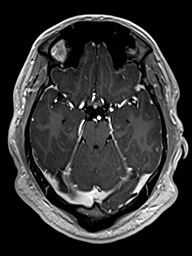
[im 80/160]
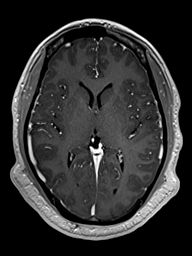
[im 100/160]
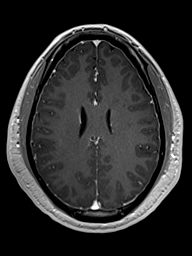
[im 120/160]
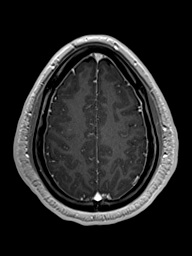
[im 140/160]
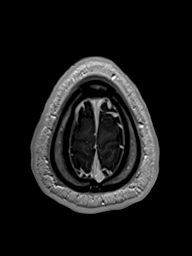
[im 160/160]
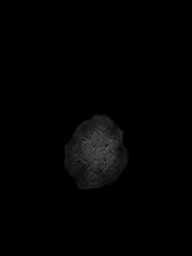

[Series 21: T1 post-contrast · coronal · 4.5mm · 0.72mm/px · 2 of 37 slices shown]
[im 1/37]
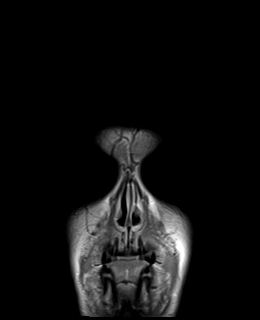
[im 37/37]
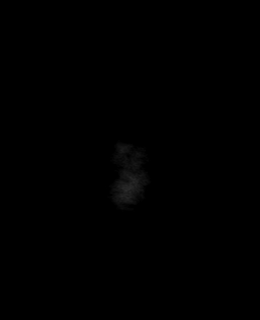

[48 of 48 positions shown; findings below may reference images not displayed]

FINDINGS: Brain: Multiple white matter lesions are identified in the cerebral
hemispheres bilaterally. Most of the lesions are periventricular and
have orientation suggestive of multiple sclerosis. Ill-defined
patchy hyperintensity in the right frontal white matter.

Small hyperintensity with enhancement in the left posterolateral
temporal lobe. Curvilinear enhancement, suggestive of acute
demyelinization. Small area of enhancement in the left parietal
white matter measuring up to 3 mm. No lesion show restricted
diffusion.

Ventricle size normal. No acute infarct or mass. Negative for
intracranial hemorrhage.

Vascular: Normal arterial flow voids

Skull and upper cervical spine: Negative

Sinuses/Orbits: Mild mucosal edema paranasal sinuses.  Normal orbit

Other: None
IMPRESSION: Bilateral white matter lesions are highly suspicious for multiple
sclerosis. Enhancing lesion left posterolateral temporal lobe
compatible with active demyelinization. 3 mm enhancement left
parietal white matter also likely due to acute demyelinization.

## 2022-09-22 ENCOUNTER — Encounter (HOSPITAL_COMMUNITY): Payer: Self-pay | Admitting: *Deleted

## 2023-09-28 ENCOUNTER — Encounter (HOSPITAL_COMMUNITY): Payer: Self-pay | Admitting: *Deleted

## 2023-10-18 ENCOUNTER — Ambulatory Visit
Admission: EM | Admit: 2023-10-18 | Discharge: 2023-10-18 | Disposition: A | Discharge status: Home / Self Care | Attending: Family Medicine | Admitting: Family Medicine

## 2023-10-18 DIAGNOSIS — Z91018 Allergy to other foods: Secondary | ICD-10-CM

## 2023-10-18 NOTE — ED Triage Notes (Signed)
 Pt states his mouth feels dry, lips are itchy x 1 week.

## 2023-10-18 NOTE — ED Provider Notes (Signed)
 [facility]    CSN: [id number] Arrival date & time: 10/18/23  1328      History   Chief Complaint Chief Complaint  Patient presents with   dry mouth    HPI Kamonte Mcmichen. is a 33 y.o. male presents for dry mouth.  Patient states he has been eating a lot of almonds recently and noticed over the past week that his throat and mouth will feel itchy and dry after eating them.  He denies any swelling of the mouth or throat, coating of the inside of the mouth or tongue, rashes on lips.  No chest pain or shortness of breath.  States he has been staying hydrated.  He does not take any and denies any new medications.  He has no other concerns at this time.  HPI  Past Medical History:  Diagnosis Date   Acid reflux    Pre-diabetes    hx of    Patient Active Problem List   Diagnosis Date Noted   Bilateral chronic knee pain 03/05/2017   GERD (gastroesophageal reflux disease) 05/24/2011   Obesity 05/24/2011   Abnormal ECG 05/24/2011    Past Surgical History:  Procedure Laterality Date   HIATAL HERNIA REPAIR  03/05/2017   Procedure: HERNIA REPAIR HIATAL;  Surgeon: Tanda Locus, MD;  Location: [facility];  Service: General;;   KNEE ARTHROSCOPY Right 2014   LAPAROSCOPIC GASTRIC SLEEVE RESECTION N/A 03/05/2017   Procedure: LAPAROSCOPIC GASTRIC SLEEVE RESECTION WITH UPPER ENDO;  Surgeon: Tanda Locus, MD;  Location: [facility];  Service: General;  Laterality: N/A;   UPPER GI ENDOSCOPY  2016       Home Medications    Prior to Admission medications   Medication Sig Start Date End Date Taking? Authorizing Provider  omeprazole (PRILOSEC) 40 MG capsule Take 40 mg by mouth daily.   Yes [provider]  cyanocobalamin 1000 MCG tablet Take by mouth. 09/06/17   [provider]  Multiple Vitamins-Minerals (BARIATRIC MULTIVITAMINS/IRON) CAPS Take by mouth.    [provider]  ondansetron  (ZOFRAN -ODT) 4 MG disintegrating tablet  07/25/17   [provider]  oxyCODONE  (ROXICODONE ) 5 MG/5ML solution Take 5 mLs (5 mg total) by mouth every 4 (four) hours as needed for moderate pain or severe pain. 03/06/17   Tanda Locus, MD  pantoprazole  (PROTONIX ) 40 MG tablet Take 40 mg by mouth daily.    [provider]    Family History Family History  Problem Relation Age of Onset   Cancer Other    Hypertension Other     Social History Social History   Tobacco Use   Smoking status: Never   Smokeless tobacco: Never  Vaping Use   Vaping status: Never Used  Substance Use Topics   Alcohol use: No   Drug use: No     Allergies   Patient has no known allergies.   Review of Systems Review of Systems  HENT:         Dry mouth/itchy lips     Physical Exam Triage Vital Signs ED Triage Vitals  Encounter Vitals Group     BP 10/18/23 1401 (!) 137/97     Girls Systolic BP Percentile --      Girls Diastolic BP Percentile --      Boys Systolic BP Percentile --      Boys Diastolic BP Percentile --      Pulse Rate 10/18/23 1359 73     Resp 10/18/23 1359 16  Temp 10/18/23 1359 98 F (36.7 C)     Temp Source 10/18/23 1359 Oral     SpO2 10/18/23 1359 100 %     Weight --      Height --      Head Circumference --      Peak Flow --      Pain Score 10/18/23 1403 0     Pain Loc --      Pain Education --      Exclude from Growth Chart --    No data found.  Updated Vital Signs BP (!) 137/97   Pulse 73   Temp 98 F (36.7 C) (Oral)   Resp 16   SpO2 100%   Visual Acuity Right Eye Distance:   Left Eye Distance:   Bilateral Distance:    Right Eye Near:   Left Eye Near:    Bilateral Near:     Physical Exam Vitals and nursing note reviewed.  Constitutional:      General: He is not in acute distress.    Appearance: Normal appearance. He is not ill-appearing.  HENT:     Head: Normocephalic and atraumatic.     Mouth/Throat:     Lips: Pink.     Mouth: Mucous membranes are moist. No oral lesions or angioedema.     Tongue:  No lesions. Tongue does not deviate from midline.     Pharynx: Oropharynx is clear. No pharyngeal swelling or uvula swelling.     Comments: There is no swelling of the tongue or throat.  No thrush.  Lips are moist without cracking.  No cheilitis. Eyes:     Pupils: Pupils are equal, round, and reactive to light.  Cardiovascular:     Rate and Rhythm: Normal rate.  Pulmonary:     Effort: Pulmonary effort is normal.  Skin:    General: Skin is warm and dry.  Neurological:     General: No focal deficit present.     Mental Status: He is alert and oriented to person, place, and time.  Psychiatric:        Mood and Affect: Mood normal.        Behavior: Behavior normal.      UC Treatments / Results  Labs (all labs ordered are listed, but only abnormal results are displayed) Labs Reviewed - No data to display  EKG   Radiology No results found.  Procedures Procedures (including critical care time)  Medications Ordered in UC Medications - No data to display  Initial Impression / Assessment and Plan / UC Course  I have reviewed the triage vital signs and the nursing notes.  Pertinent labs & imaging results that were available during my care of the patient were reviewed by me and considered in my medical decision making (see chart for details).     Reviewed with patient that symptoms are likely secondary to at home analogy and that he should cut out all almond products and see how his symptoms do.  He can do sugar-free candies and Biotene to help keep the mouth moist.  PCP follow-up symptoms do not improve.  ER precautions reviewed. Final Clinical Impressions(s) / UC Diagnoses   Final diagnoses:  Almond allergy     Discharge Instructions      Almonds and almond products.  You may suck on sugar-free candies and use Biotene to help stimulate moisture in the mouth.  Follow-up with your PCP if your symptoms do not improve.    ED Prescriptions  None    PDMP not reviewed  this encounter.   Loreda Myla SAUNDERS, NP 10/18/23 [phone]

## 2023-10-18 NOTE — Discharge Instructions (Signed)
 Almonds and almond products.  You may suck on sugar-free candies and use Biotene to help stimulate moisture in the mouth.  Follow-up with your PCP if your symptoms do not improve.
